# Patient Record
Sex: Female | Born: 1940
Health system: Southern US, Community
[De-identification: ages and names within clinical notes are randomized; demographics above are authoritative.]

## PROBLEM LIST (undated history)

## (undated) DIAGNOSIS — M25511 Pain in right shoulder: Secondary | ICD-10-CM

## (undated) DIAGNOSIS — R42 Dizziness and giddiness: Secondary | ICD-10-CM

## (undated) DIAGNOSIS — E669 Obesity, unspecified: Secondary | ICD-10-CM

## (undated) DIAGNOSIS — M25512 Pain in left shoulder: Secondary | ICD-10-CM

## (undated) DIAGNOSIS — E119 Type 2 diabetes mellitus without complications: Secondary | ICD-10-CM

## (undated) DIAGNOSIS — G4700 Insomnia, unspecified: Secondary | ICD-10-CM

## (undated) DIAGNOSIS — M1990 Unspecified osteoarthritis, unspecified site: Secondary | ICD-10-CM

## (undated) DIAGNOSIS — I471 Supraventricular tachycardia: Secondary | ICD-10-CM

## (undated) DIAGNOSIS — I499 Cardiac arrhythmia, unspecified: Secondary | ICD-10-CM

## (undated) HISTORY — DX: Insomnia, unspecified: G47.00

## (undated) HISTORY — PX: CARDIAC CATHETERIZATION: SHX172

## (undated) HISTORY — DX: Unspecified osteoarthritis, unspecified site: M19.90

## (undated) HISTORY — DX: Supraventricular tachycardia: I47.1

## (undated) HISTORY — PX: TONSILLECTOMY: SUR1361

## (undated) HISTORY — DX: Pain in left shoulder: M25.512

## (undated) HISTORY — DX: Obesity, unspecified: E66.9

## (undated) HISTORY — DX: Dizziness and giddiness: R42

## (undated) HISTORY — DX: Pain in right shoulder: M25.511

---

## 1968-11-06 HISTORY — PX: EXCISIONAL HEMORRHOIDECTOMY: SHX1541

## 1983-11-07 HISTORY — PX: CHOLECYSTECTOMY: SHX55

## 1988-11-06 DIAGNOSIS — M1990 Unspecified osteoarthritis, unspecified site: Secondary | ICD-10-CM

## 1988-11-06 HISTORY — DX: Unspecified osteoarthritis, unspecified site: M19.90

## 2001-08-08 ENCOUNTER — Ambulatory Visit (HOSPITAL_COMMUNITY): Admission: RE | Admit: 2001-08-08 | Discharge: 2001-08-08 | Payer: Self-pay | Admitting: Family Medicine

## 2001-08-08 ENCOUNTER — Encounter: Payer: Self-pay | Admitting: Family Medicine

## 2002-11-09 ENCOUNTER — Inpatient Hospital Stay (HOSPITAL_COMMUNITY): Admission: EM | Admit: 2002-11-09 | Discharge: 2002-11-10 | Payer: Self-pay | Admitting: Emergency Medicine

## 2002-11-09 ENCOUNTER — Encounter: Payer: Self-pay | Admitting: Emergency Medicine

## 2003-09-23 ENCOUNTER — Ambulatory Visit (HOSPITAL_COMMUNITY): Admission: RE | Admit: 2003-09-23 | Discharge: 2003-09-23 | Payer: Self-pay | Admitting: Family Medicine

## 2004-03-21 ENCOUNTER — Ambulatory Visit (HOSPITAL_COMMUNITY): Admission: RE | Admit: 2004-03-21 | Discharge: 2004-03-21 | Payer: Self-pay | Admitting: Family Medicine

## 2004-10-07 ENCOUNTER — Other Ambulatory Visit: Admission: RE | Admit: 2004-10-07 | Discharge: 2004-10-07 | Payer: Self-pay | Admitting: Obstetrics and Gynecology

## 2006-03-19 ENCOUNTER — Emergency Department (HOSPITAL_COMMUNITY): Admission: EM | Admit: 2006-03-19 | Discharge: 2006-03-20 | Payer: Self-pay | Admitting: Emergency Medicine

## 2006-05-24 ENCOUNTER — Ambulatory Visit: Payer: Self-pay | Admitting: Family Medicine

## 2006-05-25 ENCOUNTER — Encounter: Admission: RE | Admit: 2006-05-25 | Discharge: 2006-05-25 | Payer: Self-pay | Admitting: Family Medicine

## 2006-09-19 ENCOUNTER — Ambulatory Visit: Payer: Self-pay | Admitting: Family Medicine

## 2007-01-28 ENCOUNTER — Ambulatory Visit: Payer: Self-pay | Admitting: Family Medicine

## 2007-01-28 LAB — CONVERTED CEMR LAB
HDL: 63.6 mg/dL (ref >39.0)
VLDL: 17 mg/dL (ref 0–40)

## 2007-02-12 ENCOUNTER — Encounter: Payer: Self-pay | Admitting: Family Medicine

## 2007-05-23 ENCOUNTER — Ambulatory Visit: Payer: Self-pay | Admitting: Family Medicine

## 2007-05-23 DIAGNOSIS — H60509 Unspecified acute noninfective otitis externa, unspecified ear: Secondary | ICD-10-CM | POA: Insufficient documentation

## 2007-05-23 DIAGNOSIS — G4700 Insomnia, unspecified: Secondary | ICD-10-CM | POA: Insufficient documentation

## 2007-06-04 ENCOUNTER — Telehealth (INDEPENDENT_AMBULATORY_CARE_PROVIDER_SITE_OTHER): Payer: Self-pay | Admitting: *Deleted

## 2007-06-24 ENCOUNTER — Ambulatory Visit: Payer: Self-pay

## 2007-06-24 ENCOUNTER — Ambulatory Visit: Payer: Self-pay | Admitting: Family Medicine

## 2007-06-24 DIAGNOSIS — M79609 Pain in unspecified limb: Secondary | ICD-10-CM | POA: Insufficient documentation

## 2007-06-25 ENCOUNTER — Telehealth (INDEPENDENT_AMBULATORY_CARE_PROVIDER_SITE_OTHER): Payer: Self-pay | Admitting: *Deleted

## 2007-07-01 ENCOUNTER — Telehealth (INDEPENDENT_AMBULATORY_CARE_PROVIDER_SITE_OTHER): Payer: Self-pay | Admitting: *Deleted

## 2007-07-10 ENCOUNTER — Encounter (INDEPENDENT_AMBULATORY_CARE_PROVIDER_SITE_OTHER): Payer: Self-pay | Admitting: Family Medicine

## 2007-09-03 ENCOUNTER — Telehealth (INDEPENDENT_AMBULATORY_CARE_PROVIDER_SITE_OTHER): Payer: Self-pay | Admitting: *Deleted

## 2007-09-04 ENCOUNTER — Encounter (INDEPENDENT_AMBULATORY_CARE_PROVIDER_SITE_OTHER): Payer: Self-pay | Admitting: Family Medicine

## 2007-09-12 ENCOUNTER — Encounter (INDEPENDENT_AMBULATORY_CARE_PROVIDER_SITE_OTHER): Payer: Self-pay | Admitting: Family Medicine

## 2007-09-12 ENCOUNTER — Encounter: Admission: RE | Admit: 2007-09-12 | Discharge: 2007-09-12 | Payer: Self-pay | Admitting: Family Medicine

## 2007-10-01 ENCOUNTER — Encounter (INDEPENDENT_AMBULATORY_CARE_PROVIDER_SITE_OTHER): Payer: Self-pay | Admitting: Family Medicine

## 2007-10-01 ENCOUNTER — Encounter: Admission: RE | Admit: 2007-10-01 | Discharge: 2007-10-01 | Payer: Self-pay | Admitting: Interventional Radiology

## 2007-11-29 ENCOUNTER — Ambulatory Visit: Payer: Self-pay | Admitting: Family Medicine

## 2007-12-10 ENCOUNTER — Encounter (INDEPENDENT_AMBULATORY_CARE_PROVIDER_SITE_OTHER): Payer: Self-pay | Admitting: Family Medicine

## 2007-12-10 ENCOUNTER — Encounter: Admission: RE | Admit: 2007-12-10 | Discharge: 2007-12-10 | Payer: Self-pay | Admitting: Interventional Radiology

## 2007-12-24 ENCOUNTER — Encounter: Admission: RE | Admit: 2007-12-24 | Discharge: 2007-12-24 | Payer: Self-pay | Admitting: Interventional Radiology

## 2008-01-01 ENCOUNTER — Encounter: Admission: RE | Admit: 2008-01-01 | Discharge: 2008-01-01 | Payer: Self-pay | Admitting: Interventional Radiology

## 2008-03-02 ENCOUNTER — Ambulatory Visit: Payer: Self-pay | Admitting: Internal Medicine

## 2008-03-02 DIAGNOSIS — H6690 Otitis media, unspecified, unspecified ear: Secondary | ICD-10-CM | POA: Insufficient documentation

## 2008-03-02 DIAGNOSIS — J0100 Acute maxillary sinusitis, unspecified: Secondary | ICD-10-CM | POA: Insufficient documentation

## 2008-03-11 ENCOUNTER — Encounter: Payer: Self-pay | Admitting: Family Medicine

## 2008-04-13 ENCOUNTER — Ambulatory Visit: Payer: Self-pay | Admitting: Family Medicine

## 2008-04-13 ENCOUNTER — Encounter (INDEPENDENT_AMBULATORY_CARE_PROVIDER_SITE_OTHER): Payer: Self-pay | Admitting: *Deleted

## 2008-04-13 DIAGNOSIS — H9190 Unspecified hearing loss, unspecified ear: Secondary | ICD-10-CM | POA: Insufficient documentation

## 2008-04-21 ENCOUNTER — Encounter: Payer: Self-pay | Admitting: Family Medicine

## 2008-05-18 ENCOUNTER — Ambulatory Visit: Payer: Self-pay | Admitting: Family Medicine

## 2008-05-18 ENCOUNTER — Encounter (INDEPENDENT_AMBULATORY_CARE_PROVIDER_SITE_OTHER): Payer: Self-pay | Admitting: *Deleted

## 2008-07-28 ENCOUNTER — Telehealth (INDEPENDENT_AMBULATORY_CARE_PROVIDER_SITE_OTHER): Payer: Self-pay | Admitting: *Deleted

## 2008-11-06 DIAGNOSIS — I499 Cardiac arrhythmia, unspecified: Secondary | ICD-10-CM

## 2008-11-06 HISTORY — DX: Cardiac arrhythmia, unspecified: I49.9

## 2009-05-21 ENCOUNTER — Ambulatory Visit: Payer: Self-pay | Admitting: Family Medicine

## 2009-06-06 LAB — CONVERTED CEMR LAB
AST: 21 units/L (ref 0–37)
Alkaline Phosphatase: 73 units/L (ref 39–117)
Bilirubin, Direct: 0 mg/dL (ref 0.0–0.3)
CO2: 32 meq/L (ref 19–32)
Cholesterol: 175 mg/dL (ref 0–200)
Creatinine, Ser: 0.6 mg/dL (ref 0.4–1.2)
Eosinophils Absolute: 0.1 10*3/uL (ref 0.0–0.7)
Glucose, Bld: 92 mg/dL (ref 70–99)
Hemoglobin: 14.2 g/dL (ref 12.0–15.0)
Lymphs Abs: 1.8 10*3/uL (ref 0.7–4.0)
MCV: 88.1 fL (ref 78.0–100.0)
Monocytes Relative: 6.4 % (ref 3.0–12.0)
Platelets: 250 10*3/uL (ref 150.0–400.0)
TSH: 1.9 microintl units/mL (ref 0.35–5.50)
Total Bilirubin: 0.9 mg/dL (ref 0.3–1.2)
Total Protein: 7.1 g/dL (ref 6.0–8.3)
Triglycerides: 162 mg/dL — ABNORMAL HIGH (ref 0.0–149.0)
VLDL: 32.4 mg/dL (ref 0.0–40.0)

## 2009-06-07 ENCOUNTER — Encounter (INDEPENDENT_AMBULATORY_CARE_PROVIDER_SITE_OTHER): Payer: Self-pay | Admitting: *Deleted

## 2009-09-14 ENCOUNTER — Ambulatory Visit: Payer: Self-pay | Admitting: Family

## 2009-09-15 ENCOUNTER — Ambulatory Visit: Payer: Self-pay | Admitting: Family

## 2009-09-15 DIAGNOSIS — M5430 Sciatica, unspecified side: Secondary | ICD-10-CM | POA: Insufficient documentation

## 2010-05-02 ENCOUNTER — Observation Stay (HOSPITAL_COMMUNITY): Admission: EM | Admit: 2010-05-02 | Discharge: 2010-05-03 | Payer: Self-pay | Admitting: Emergency Medicine

## 2010-05-02 ENCOUNTER — Encounter (INDEPENDENT_AMBULATORY_CARE_PROVIDER_SITE_OTHER): Payer: Self-pay | Admitting: Cardiology

## 2011-01-11 ENCOUNTER — Ambulatory Visit (HOSPITAL_BASED_OUTPATIENT_CLINIC_OR_DEPARTMENT_OTHER)
Admission: RE | Admit: 2011-01-11 | Discharge: 2011-01-11 | Disposition: A | Discharge status: Home / Self Care | Payer: Medicare Other | Source: Ambulatory Visit | Attending: Family Medicine | Admitting: Family Medicine

## 2011-01-11 ENCOUNTER — Encounter: Payer: Self-pay | Admitting: Family Medicine

## 2011-01-11 ENCOUNTER — Other Ambulatory Visit: Payer: Self-pay | Admitting: Family Medicine

## 2011-01-11 ENCOUNTER — Ambulatory Visit (INDEPENDENT_AMBULATORY_CARE_PROVIDER_SITE_OTHER): Payer: Medicare Other | Admitting: Family Medicine

## 2011-01-11 DIAGNOSIS — M545 Low back pain, unspecified: Secondary | ICD-10-CM

## 2011-01-11 DIAGNOSIS — M5380 Other specified dorsopathies, site unspecified: Secondary | ICD-10-CM | POA: Insufficient documentation

## 2011-01-11 DIAGNOSIS — M25519 Pain in unspecified shoulder: Secondary | ICD-10-CM | POA: Insufficient documentation

## 2011-01-17 NOTE — Assessment & Plan Note (Signed)
Summary: KNEE AND SHOULDER PAIN/NP/LP # [phone]   Vital Signs:  Patient profile:   70 year old female Height:      64 inches (162.56 cm) Weight:      244.6 pounds (111.18 kg) BMI:     42.14 Temp:     98.1 degrees F (36.72 degrees C) oral Pulse rate:   68 / minute BP sitting:   148 / 80  (right arm)  Vitals Entered By: Baxter Hire) (January 11, 2011 11:20 AM) CC: Right and Left arms / Left hip pain Pain Assessment Patient in pain? yes     Location: arms Intensity: 2 Onset of pain  Arm pain worse at night / left hip worse at night 10/10 Nutritional Status BMI of > 30 = obese  Does patient need assistance? Functional Status Self care Ambulation Normal   Primary Care Provider:  Loreen Freud DO  CC:  Right and Left arms / Left hip pain.  History of Present Illness: 70 yo F here with multiple complaints.  1. Bilateral shoulder pain Pain started in left shoulder 1 year ago, right shoulder 1 month ago No known injury + night pain Pain worse with reaching and overhead activities Can move arms fully but just hurts to do so No numbness or tingling No neck pain No remote shoulder problems Has not had x-rays, injection or other workup for these issues since they started. Is right handed  2. Left low back pain Describes several year history of low back pain, posterior hip pain with radiation into left leg Pain worse at bedtime Occasional numbness but pain is primary complaint No bowel/bladder dysfunction No groin pain - her hip pain is synonymous with this back pain  Patient also having left hip pain and right knee pain - deferred, also seeing an orthopedist in [place] for R knee DJD - considering partial knee replacement.  Habits & Providers  Alcohol-Tobacco-Diet     Tobacco Status: never  Current Problems (verified): 1)  Lumbago  (ICD-724.2) 2)  Sciatica, Left  (ICD-724.3) 3)  Preventive Health Care  (ICD-V70.0) 4)  Foot Pain, Left   (ICD-729.5) 5)  Decreased Hearing, Right Ear  (ICD-389.9) 6)  Acute Maxillary Sinusitis  (ICD-461.0) 7)  Otitis Media, Acute  (ICD-382.9) 8)  Leg Pain, Right  (ICD-729.5) 9)  Otitis Externa, Acute Nec  (ICD-380.22) 10)  Insomnia, Persistent  (ICD-307.42)  Current Medications (verified): 1)  Aleve 220 Mg Caps (Naproxen Sodium) .... One Tablet Twice Daily As Needed For Pain 2)  Beta-Blocker - Unknown Which One  Allergies: 1)  ! Codeine 2)  ! * Rum  Family History: + heart disease mom negative DM, HTN  Social History: never smoked occasional glass of wine Retired from [employer] Smoking Status:  never  Physical Exam  General:  NAD, obese Msk:  Bilateral shoulders: No gross deformity or bruising. No focal biceps or AC joint TTP. FROM with painful arcs - no frozen shoulder Strength 4/5 with empty can bilaterally, 5/5 with resisted IR/ER - pain reproduced with empty can but not with IR/ER. + Hawkins and neers bilaterally Negative Yergasons NVI distally.  Back: Mildly accentuated lordosis. No other gross deformity, evidence of scoliosis. No focal midline/bony TTP.  Mild L lumbar paraspinal TTP.  Mod TTP L buttock, prox hamstrings, greater trochanter. ROM mildly limited with flexion, full extension Negative logroll of hip - FROM. Strength 4/5 with hip flexion, abduction, ext rotation, knee extension - ? limited by pain though.  Otherwise  5/5 strength BLEs MSRs 1+ and equal bilateral patellar and achilles tendons Sensation intact to light touch. Negative SLRs.   Impression & Recommendations:  Problem # 1:  LUMBAGO (ICD-724.2) Assessment Deteriorated Patient's symptoms and exam consistent with left lumbar radiculopathy.  Does have tenderness at trochanter but tenderness is also diffuse in left lumbar paraspinal region, buttock, proximal left leg which is consistent with irritated lumbar nerve root.  Start physical therapy for exercises, stretches and modalities.   Continue aleve twice a day with food.  Will try to avoid dose pack.  X-rays today to assess her level of DDD.  If not improving will consider MRI of lumbar spine.   Her updated medication list for this problem includes:    Aleve 220 Mg Caps (Naproxen sodium) ..... One tablet twice daily as needed for pain  Orders: Diagnostic X-Ray/Fluoroscopy (Diagnostic X-Ray/Flu)  Problem # 2:  SHOULDER PAIN, BILATERAL (ICD-719.41) Assessment: New No acute injury to suggest rotator cuff tear.  Full motion so no adhesive capsulitis.  Symptoms and exam c/w bilateral rotator cuff impingement.  Offered cortisone injections today but she declined.  Would like to try physical therapy first.  Continue aleve.  Ice or heat as needed.  F/u in 1 month for recheck.  Reconsider injections if not improving.  Her updated medication list for this problem includes:    Aleve 220 Mg Caps (Naproxen sodium) ..... One tablet twice daily as needed for pain  Complete Medication List: 1)  Aleve 220 Mg Caps (Naproxen sodium) .... One tablet twice daily as needed for pain 2)  Beta-blocker - Unknown Which One   Patient Instructions: 1)  You have bilateral rotator cuff impingement and left lumbar radiculopathy (pinched nerve in low back). 2)  Both are treated similarly initially. 3)  Start physical therapy for both over next 4-6 weeks and do home exercises as directed. 4)  Ice or heat, whichever feels better for 15 minutes at a time 3-4 times a day. 5)  A cortisone injection is an option for both of your shoulders if not improving. 6)  Continue the aleve 1-2 tabs twice a day with food. 7)  Get the x-rays of your back at your convenience. 8)  If back is not improving, next step is to do MRI and consider other medicines, shots, or surgery depending on how study looks. 9)  Follow up with me in 1 month for reevaluation.   Orders Added: 1)  Diagnostic X-Ray/Fluoroscopy [Diagnostic X-Ray/Flu] 2)  New Patient Level IV [11914]

## 2011-01-18 ENCOUNTER — Ambulatory Visit: Payer: Medicare Other | Admitting: Physical Therapy

## 2011-01-22 LAB — CARDIAC PANEL(CRET KIN+CKTOT+MB+TROPI)
CK, MB: 1.9 ng/mL (ref 0.3–4.0)
Relative Index: INVALID (ref 0.0–2.5)
Total CK: 34 U/L (ref 7–177)
Total CK: 46 U/L (ref 7–177)

## 2011-01-22 LAB — COMPREHENSIVE METABOLIC PANEL
ALT: 12 U/L (ref 0–35)
Alkaline Phosphatase: 65 U/L (ref 39–117)
CO2: 28 mEq/L (ref 19–32)
Creatinine, Ser: 0.62 mg/dL (ref 0.4–1.2)
Glucose, Bld: 93 mg/dL (ref 70–99)
Potassium: 4.4 mEq/L (ref 3.5–5.1)
Total Bilirubin: 0.6 mg/dL (ref 0.3–1.2)

## 2011-01-22 LAB — DIFFERENTIAL
Basophils Absolute: 0 10*3/uL (ref 0.0–0.1)
Lymphocytes Relative: 21 % (ref 12–46)
Monocytes Absolute: 1 10*3/uL (ref 0.1–1.0)
Monocytes Relative: 6 % (ref 3–12)

## 2011-01-22 LAB — CULTURE, BLOOD (ROUTINE X 2): Culture: NO GROWTH

## 2011-01-22 LAB — CBC
HCT: 43.6 % (ref 36.0–46.0)
Hemoglobin: 15 g/dL (ref 12.0–15.0)
MCH: 30.1 pg (ref 26.0–34.0)
MCHC: 34.3 g/dL (ref 30.0–36.0)
Platelets: ADEQUATE 10*3/uL (ref 150–400)
RBC: 4.97 MIL/uL (ref 3.87–5.11)
RDW: 13.7 % (ref 11.5–15.5)
WBC: 16.4 10*3/uL — ABNORMAL HIGH (ref 4.0–10.5)

## 2011-01-25 ENCOUNTER — Ambulatory Visit: Payer: Medicare Other | Attending: Family Medicine | Admitting: Physical Therapy

## 2011-01-25 DIAGNOSIS — M2550 Pain in unspecified joint: Secondary | ICD-10-CM | POA: Insufficient documentation

## 2011-01-25 DIAGNOSIS — IMO0001 Reserved for inherently not codable concepts without codable children: Secondary | ICD-10-CM | POA: Insufficient documentation

## 2011-01-25 DIAGNOSIS — R293 Abnormal posture: Secondary | ICD-10-CM | POA: Insufficient documentation

## 2011-01-25 DIAGNOSIS — M2560 Stiffness of unspecified joint, not elsewhere classified: Secondary | ICD-10-CM | POA: Insufficient documentation

## 2011-02-02 ENCOUNTER — Ambulatory Visit: Payer: Medicare Other | Attending: Family Medicine | Admitting: Physical Therapy

## 2011-02-02 DIAGNOSIS — IMO0001 Reserved for inherently not codable concepts without codable children: Secondary | ICD-10-CM | POA: Insufficient documentation

## 2011-02-02 DIAGNOSIS — M2560 Stiffness of unspecified joint, not elsewhere classified: Secondary | ICD-10-CM | POA: Insufficient documentation

## 2011-02-02 DIAGNOSIS — R293 Abnormal posture: Secondary | ICD-10-CM | POA: Insufficient documentation

## 2011-02-02 DIAGNOSIS — M2550 Pain in unspecified joint: Secondary | ICD-10-CM | POA: Insufficient documentation

## 2011-02-08 ENCOUNTER — Ambulatory Visit: Payer: Medicare Other | Attending: Family Medicine | Admitting: Physical Therapy

## 2011-02-08 DIAGNOSIS — M2550 Pain in unspecified joint: Secondary | ICD-10-CM | POA: Insufficient documentation

## 2011-02-08 DIAGNOSIS — R293 Abnormal posture: Secondary | ICD-10-CM | POA: Insufficient documentation

## 2011-02-08 DIAGNOSIS — IMO0001 Reserved for inherently not codable concepts without codable children: Secondary | ICD-10-CM | POA: Insufficient documentation

## 2011-02-08 DIAGNOSIS — M2560 Stiffness of unspecified joint, not elsewhere classified: Secondary | ICD-10-CM | POA: Insufficient documentation

## 2011-02-16 ENCOUNTER — Ambulatory Visit: Payer: Medicare Other | Admitting: Physical Therapy

## 2011-02-17 ENCOUNTER — Encounter: Payer: Self-pay | Admitting: Family Medicine

## 2011-02-17 ENCOUNTER — Ambulatory Visit (INDEPENDENT_AMBULATORY_CARE_PROVIDER_SITE_OTHER): Payer: Medicare Other | Admitting: Family Medicine

## 2011-02-17 VITALS — BP 139/87 | HR 83 | Temp 98.2°F | Ht 64.0 in | Wt 234.0 lb

## 2011-02-17 DIAGNOSIS — M25519 Pain in unspecified shoulder: Secondary | ICD-10-CM

## 2011-02-21 ENCOUNTER — Encounter: Payer: Self-pay | Admitting: Family Medicine

## 2011-02-21 NOTE — Assessment & Plan Note (Signed)
No acute injury to suggest rotator cuff tear.  Full motion so no adhesive capsulitis.  Symptoms and exam c/w bilateral rotator cuff impingement.  Left shoulder much improved but right shoulder still painful.  Declined cortisone injection again today - would like to continue with PT and aleve which i believe is reasonable given she's only been over 2-3 weeks.  If not improving after 4-6 weeks from today, will consider injection and/or shoulder ultrasound.

## 2011-02-21 NOTE — Progress Notes (Signed)
Subjective:    Patient ID: Anita Moore, female    DOB: 02-25-1941, 70 y.o.   MRN: [id number]  HPI CC:  Right and Left arms / Left hip pain.  History of Present Illness: 70 yo F here with multiple complaints.  1. Bilateral shoulder pain Last OV 6 weeks ago: Pain started in left shoulder 1 year ago, right shoulder 1 month ago No known injury + night pain Pain worse with reaching and overhead activities Can move arms fully but just hurts to do so No numbness or tingling No neck pain No remote shoulder problems Has not had x-rays, injection or other workup for these issues since they started. Is right handed  Today: Reports left shoulder much improved but right shoulder still painful. Gone to PT for only 5-6 visits over past 2-3 weeks. Gets relief there but just started doing theraband exercises + night pain Does not want injection - wants to continue with therapy Taking aleve as needed.  2. Left low back pain Last OV: Describes several year history of low back pain, posterior hip pain with radiation into left leg Pain worse at bedtime Occasional numbness but pain is primary complaint No bowel/bladder dysfunction No groin pain - her hip pain is synonymous with this back pain This has improved from last visit - wanted to focus on her shoulders. Patient also having left hip pain and right knee pain - deferred, also seeing an orthopedist in [place] for R knee DJD - considering partial knee replacement.  Past Medical History  Diagnosis Date  . Insomnia   . Bilateral shoulder pain     No current outpatient prescriptions on file prior to visit.    No past surgical history on file.  Allergies  Allergen Reactions  . Codeine     History   Social History  . Marital Status: Divorced    Spouse Name: N/A    Number of Children: N/A  . Years of Education: N/A   Occupational History  . Not on file.   Social History Main Topics  . Smoking status: Never  Smoker   . Smokeless tobacco: Not on file  . Alcohol Use: Not on file  . Drug Use: Not on file  . Sexually Active: Not on file   Other Topics Concern  . Not on file   Social History Narrative  . No narrative on file    Family History  Problem Relation Age of Onset  . Heart attack Mother   . Diabetes Neg Hx   . Hypertension Neg Hx     BP 139/87  Pulse 83  Temp(Src) 98.2 F (36.8 C) (Oral)  Ht 5\' 4"  (1.626 m)  Wt 234 lb (106.142 kg)  BMI 40.17 kg/m2  Review of Systems See HPI above.     Objective:   Physical Exam General:  NAD, obese Msk:  Bilateral shoulders: No gross deformity or bruising. No focal biceps or AC joint TTP. FROM with painful arcs R > L - no frozen shoulder.  Active ROM on right 100 degrees abduction, 100 degrees flexion but painful. Strength 4/5 with empty can bilaterally, 5/5 with resisted IR/ER - pain reproduced with empty can but not with IR/ER. + Hawkins and neers bilaterally R worse than L. NVI distally.     Assessment & Plan:  1. Bilateral shoulder pain - No acute injury to suggest rotator cuff tear.  Full motion so no adhesive capsulitis.  Symptoms and exam c/w bilateral rotator cuff impingement.  Left shoulder much improved but right shoulder still painful.  Declined cortisone injection again today - would like to continue with PT and aleve which i believe is reasonable given she's only been over 2-3 weeks.  If not improving after 4-6 weeks from today, will consider injection and/or shoulder ultrasound.

## 2011-03-08 ENCOUNTER — Ambulatory Visit: Payer: Medicare Other | Attending: Family Medicine | Admitting: Physical Therapy

## 2011-03-08 DIAGNOSIS — R293 Abnormal posture: Secondary | ICD-10-CM | POA: Insufficient documentation

## 2011-03-08 DIAGNOSIS — M2550 Pain in unspecified joint: Secondary | ICD-10-CM | POA: Insufficient documentation

## 2011-03-08 DIAGNOSIS — IMO0001 Reserved for inherently not codable concepts without codable children: Secondary | ICD-10-CM | POA: Insufficient documentation

## 2011-03-08 DIAGNOSIS — M2560 Stiffness of unspecified joint, not elsewhere classified: Secondary | ICD-10-CM | POA: Insufficient documentation

## 2011-03-14 ENCOUNTER — Ambulatory Visit: Payer: Medicare Other | Admitting: Rehabilitation

## 2011-03-20 ENCOUNTER — Ambulatory Visit (INDEPENDENT_AMBULATORY_CARE_PROVIDER_SITE_OTHER): Payer: Medicare Other | Admitting: Family Medicine

## 2011-03-20 ENCOUNTER — Encounter: Payer: Self-pay | Admitting: Family Medicine

## 2011-03-20 VITALS — BP 151/81 | HR 68 | Temp 98.4°F | Ht 64.0 in | Wt 235.0 lb

## 2011-03-20 DIAGNOSIS — M25519 Pain in unspecified shoulder: Secondary | ICD-10-CM

## 2011-03-20 NOTE — Progress Notes (Signed)
Subjective:    Patient ID: Anita Moore, female    DOB: [birth year], 70 y.o.   MRN: [id number]  HPI  CC:  Right and Left arms / Left hip pain.  History of Present Illness: 70 yo F here for 1 month f/u bilateral shoulder pain.  1. Bilateral shoulder pain Initial OV 10 weeks ago: Pain started in left shoulder >1 year ago, right shoulder 1 month ago No known injury + night pain Pain worse with reaching and overhead activities Can move arms fully but just hurts to do so No numbness or tingling No neck pain No remote shoulder problems Has not had x-rays, injection or other workup for these issues since they started. Is right handed  Today: Bilateral shoulder pain down to 1/10 Started taking hyaluronic acid a few days ago and noted this has helped with pain. Gone to PT for total of 5-6 visits leading up to last OV.  States has been compliant with home exercise program. Reiterated she does not want injection - wants to continue with therapy and home exercises, hyaluronic acid. Taking aleve as needed.  Past Medical History  Diagnosis Date  . Insomnia   . Bilateral shoulder pain     No current outpatient prescriptions on file prior to visit.    No past surgical history on file.  Allergies  Allergen Reactions  . Codeine     History   Social History  . Marital Status: Divorced    Spouse Name: N/A    Number of Children: N/A  . Years of Education: N/A   Occupational History  . Not on file.   Social History Main Topics  . Smoking status: Never Smoker   . Smokeless tobacco: Not on file  . Alcohol Use: Not on file  . Drug Use: Not on file  . Sexually Active: Not on file   Other Topics Concern  . Not on file   Social History Narrative  . No narrative on file    Family History  Problem Relation Age of Onset  . Heart attack Mother   . Diabetes Neg Hx   . Hypertension Neg Hx     BP 151/81  Pulse 68  Temp(Src) 98.4 F (36.9 C) (Oral)  Ht 5\' 4"   (1.626 m)  Wt 235 lb (106.595 kg)  BMI 40.34 kg/m2  Review of Systems  See HPI above.     Objective:   Physical Exam  General:  NAD, obese Msk:  Bilateral shoulders: No gross deformity or bruising. No focal biceps or AC joint TTP. FROM with painful arcs R > L - no frozen shoulder.  Active ROM on right 100 degrees abduction, 100 degrees flexion. Strength 4/5 with empty can bilaterally, 5/5 with resisted IR/ER - pain reproduced with empty can but not with IR/ER. + Hawkins and neers bilaterally R worse than L. NVI distally.     Assessment & Plan:  1. Bilateral shoulder pain - Patient's pain subjectively improved - unsure if she went to additional PT sessions since last visit.  She canceled last 2 weeks.  States she has been compliant with home exercises.  Again, no acute injury to suggest rotator cuff tear.  Full motion so no adhesive capsulitis.  Symptoms and exam c/w bilateral rotator cuff impingement.  She would like to continue with conservative treatment and hyaluronic acid.  Advised her to continue with PT as well.  Does not want a cortisone injection.  Given that she feels better, will hold off on  this, ultrasound.  F/u in 6 weeks for recheck.

## 2011-03-20 NOTE — Assessment & Plan Note (Signed)
Patient's pain subjectively improved - unsure if she went to additional PT sessions since last visit.  She canceled last 2 weeks.  States she has been compliant with home exercises.  Again, no acute injury to suggest rotator cuff tear.  Full motion so no adhesive capsulitis.  Symptoms and exam c/w bilateral rotator cuff impingement.  She would like to continue with conservative treatment and hyaluronic acid.  Advised her to continue with PT as well.  Does not want a cortisone injection.  Given that she feels better, will hold off on this, ultrasound.  F/u in 6 weeks for recheck.

## 2011-03-24 NOTE — Discharge Summary (Signed)
NAME:  Anita Moore, Anita Moore                   ACCOUNT NO.:  [id number]   MEDICAL RECORD NO.:  [id number]                   PATIENT TYPE:  INP   LOCATION:  2899                                 FACILITY:  [facility]   PHYSICIAN:  Francisca December, M.D.               DATE OF BIRTH:  10-20-1941   DATE OF ADMISSION:  11/09/2002  DATE OF DISCHARGE:  11/10/2002                                 DISCHARGE SUMMARY   ADMISSION DIAGNOSES:  1. Unstable angina, rule out myocardial infarction.  2. Hyperlipidemia.  3. Degenerative joint disease.  4. Obesity.   DISCHARGE DIAGNOSES:  1. Unstable angina, myocardial infarction ruled out, suspect     gastrointestinal etiology.  2. Essentially normal coronaries per cardiac catheterization.  3. Hyperlipidemia.  4. Degenerative joint disease.  5. Obesity.   HISTORY OF PRESENT ILLNESS:  The patient is a 70 year old white female  patient of Dr. Amil Amen with a history of hyperlipidemia and degenerative  joint disease.  She had developed substernal chest burning pain around 1  a.m. on November 09, 2002.  This persisted with varying intensity until she  arrived at the [facility] emergency room.  She also had left-hand  numbness.  She has had chronic left shoulder and upper arm pain. No nausea,  diaphoresis, or shortness of breath.  She had nondiagnostic Cardiolite in  the fall of 2003 and declined cardiac catheterization at that time.   The patient is admitted for rule out MI and probable cardiac  catheterization.   PROCEDURE:  Cardiac catheterization by Dr. Amil Amen on November 10, 2002.   COMPLICATIONS:  None.   CONSULTATIONS:  None.   HOSPITAL COURSE:  The patient was admitted to [facility] on November 09, 2002, by Dr. Amil Amen for unstable angina pain.  EKG was nonacute with  nonspecific T wave changes in the inferolateral leads.  She was started on  aspirin, beta blocker, and Lovenox.  Dr. Amil Amen discussed the need for  cardiac  catheterization and the patient consented.  She agreed to the risks  and benefits.   Admission laboratory studies were essentially within normal limits.  Cardiac  enzymes negative x2.   The patient was taken to the cardiac catheterization lab by Dr. Amil Amen on  November 10, 2002.  This revealed essentially normal coronary arteries and  normal LV function. The procedure went well.  There were no complications.  Perclose was used.  The patient was delivered to the shortstay area in  stable condition.   Dr. Amil Amen feels that the patient's symptoms are noncardiac in etiology. He  recommends initiation of proton pump inhibitor therapy.   The patient will be discharged to home after one hour of recovery time, so  long as the right groin remains stable and she is hemodynamically stable.   Discharge instructions will be reviewed with the patient.   DISCHARGE MEDICATIONS:  1. Lipitor 20 mg q.h.s.  2. Aspirin 81 mg.  3. Vioxx as needed as directed.  4. Protonix 40 mg a day (new) prescription given.  5. The patient may take Tylenol as needed as directed on the bottle for     pain.   ACTIVITY:  No strenuous activity, lifting more than 5 pounds, or driving for  two days. Then she may return to normal activity.   DIET:  Low fat, low cholesterol, low salt diet. She is to keep away from  foods that will exacerbate her indigestion type symptoms.   DISCHARGE INSTRUCTIONS:  She should keep the bandage on her right groin x2  days. She is given complete instructions on Perclose care.  She is not to  soak in the tub or swim for one week. She may shower and should dry the area  well.  She is asked to call the office with any problems or questions.  A  follow-up appointment will need to be scheduled for two weeks with Dr.  Amil Amen.  Unfortunately, our telephone system is down at the office and I  will have the patient call later today or tomorrow to make this appointment.     Georgiann Cocker Jernejcic,  P.A.                   Francisca December, M.D.    TCJ/MEDQ  D:  11/10/2002  T:  11/10/2002  Job:  [id number]
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
## 2011-03-24 NOTE — Cardiovascular Report (Signed)
NAME:  Anita Moore, Anita Moore                   ACCOUNT NO.:  [id number]   MEDICAL RECORD NO.:  [id number]                   PATIENT TYPE:  INP   LOCATION:  5529                                 FACILITY:  [facility]   PHYSICIAN:  Francisca December, M.D.               DATE OF BIRTH:  [birth year]   DATE OF PROCEDURE:  11/10/2002  DATE OF DISCHARGE:                              CARDIAC CATHETERIZATION   PROCEDURES:  1. Left heart catheterization.  2. Coronary angiography.  3. Left ventriculogram.  4. Left femoral artery angiogram.  5. Percutaneous closure (Perclose) right internal artery.   INDICATIONS FOR PROCEDURE:  The patient is a 70 year old woman with atypical  angina.  Approximately three months ago she underwent a myocardial perfusion  study which was indeterminate.  She declined cardiac catheterization at that  time.  Yesterday she developed burning substernal chest pain which lasted  throughout the day and well into the evening.  This only resolved upon  arrival at [facility] without any medications administered other than  oxygen.  She did have some left hand numbness associated.  EKG was  unremarkable.  Myocardial infarction has been ruled out by serial CK-MB and  troponin enzymes.  She was brought to the cardiac catheterization laboratory  at this time to identify the extent of the disease and provide further  therapeutic options.   PROCEDURE IN DETAIL:  The patient was brought to the cardiac catheterization  laboratory in the postabsorptive state.  The right groin was prepped and  draped in the usual sterile fashion.  Local anesthesia was obtained with the  infiltration of 1% Lidocaine.  A 6-French catheter sheath was inserted  percutaneously in the right femoral artery utilizing an anterior posterior  right guiding J wire.  A 110 King rear pigtail catheter was used to measure  pressures in the ascending aorta and in the left ventricle both prior to and  following the  ventriculogram.  A 30 degree RAO cine left ventriculogram was  performed utilizing the power injector.  Thallium sublingual administration  of 0.4 mg nitroglycerin cine angiography of each coronary artery was  conducted in multiple LAO and RAO projections.  All catheter manipulations  were performed using fluoroscopic observation and exchanges performed over  long guiding J wire.  At the completion of procedure the catheter and  catheter sheath were removed.  Hemostasis was achieved by use of the  Perclose system as noted above.  This was placed under sterile conditions.  She was then transported to the recovery area in stable condition and an  intact distal pulse.   HEMODYNAMICS:  1. Systolic arterial pressure was 122/69 with a mean of 93 mmHg.  There was     no systolic radiating across the aortic valve.  2. The left ventricular end diastolic pressure was 16 mmHg pre and post     ventriculogram.   LEFT VENTRICULOGRAPHY:  1. The left ventriculogram demonstrated normal chamber size  and normal     global systolic function with a visually estimated ejection fraction of     65-70%.  2. There was no mitral regurgitation.  3. There was no significant coronary calcification seen.  4. There were no regional wall motion abnormalities.   CORONARY ANGIOGRAPHY:  1. There was a right dominant coronary system present.  2. The main left coronary artery was short and normal.  3. The left anterior descending artery and its branches were normal.  The     first diagonal branch was small; the second diagonal branch is large.     The ongoing anterior descending artery reaches but barely traverses the     apex.  No obstructions or even luminal irregularities are seen within     this vessel.  4. The left circumflex coronary artery and its branches were normal.  There     is a very small first marginal branch.  The second marginal branch is     large and bifurcates on the medial lateral wall of the  heart.  It does     reach the apex.  The ongoing left circumflex gives rise to a small     posterior lateral branch.  Again, no significant obstructions are seen in     this vessel and no luminal irregularities.  5. The right coronary artery and its branches again were without significant     obstruction.  The vessel may contain a 20% narrowing in the proximal     segment but the catheter was relatively well intubated and the     irregularity there may be due to the curvature of the catheter only.     Proximal, mid and distal portions of the right coronary are smooth and     without any obstruction whatsoever.  The vessel then bifurcates in to the     posterior descending artery and the posterolateral segment.  The     posterior descending artery is large and it self bifurcates within 2 cm     of the origin.  There are no significant obstructions seen.  The     posterolateral segment gives rise to three left ventricular branches, the     first two of which are quite small, the third is moderate in size.     Again, no obstruction seen in the venous portion of the vessel.  6. Collateral vessels are not seen.   FEMORAL ARTERIOGRAPHY:  A left femoral artery angiogram was obtained in the  RAO projection right hand injection.  It documented the femoral artery  widely patent and coursing in normal fashion before bifurcating in to the  profunda femoris and the superficial femoral artery.  Again, no obstructions  are seen within the femoral artery and it is a good size vessel at least 6-7  mm in diameter.  It was judged to be suitable for the use of Perclose.   IMPRESSION:  1. Normal left ventricular systolic size and systolic function.  2. Normal coronary arteries.  3. Noncardiac chest pain.    PLAN:  The patient was presented with this gratifying news.  She will be  discharged from the outpatient center within one hour, provided no complications arise.  I will initiate treatment with proton  pump inhibitor  for presumed esophagitis and gastroesophageal reflux disease.  She should  follow up at her earliest convenience with Dr. Aundria Rud.  Francisca December, M.D.    JHE/MEDQ  D:  11/10/2002  T:  11/10/2002  Job:  [id number]   cc:   Francisca December, M.D.  [address]  [address]  [place]  [place] [address]  Fax: [phone]   Lilyan Punt. Sydnee Levans, M.D.  [address] [place]  [place] [address]  Fax: [phone]   Cardiac Catheterization Lab

## 2011-03-24 NOTE — Discharge Summary (Signed)
   NAME:  Anita Moore, Anita Moore                   ACCOUNT NO.:  [id number]   MEDICAL RECORD NO.:  [id number]                   PATIENT TYPE:  INP   LOCATION:  2899                                 FACILITY:  [facility]   PHYSICIAN:  Tara C. Jernejcic, P.A.             DATE OF BIRTH:  October 05, 1941   DATE OF ADMISSION:  11/09/2002  DATE OF DISCHARGE:                                 DISCHARGE SUMMARY   This is an addendum to discharge summary [phone].   Dr. Amil Amen has actually given her a prescription for Prilosec 20 mg one  p.o. q.d. so she will not be taking Protonix.                                               Luan Moore, P.A.    TCJ/MEDQ  D:  11/10/2002  T:  11/10/2002  Job:  [id number]

## 2011-04-04 ENCOUNTER — Other Ambulatory Visit: Payer: Self-pay | Admitting: Family Medicine

## 2011-04-05 ENCOUNTER — Other Ambulatory Visit: Payer: Self-pay | Admitting: Family Medicine

## 2011-04-05 DIAGNOSIS — N644 Mastodynia: Secondary | ICD-10-CM

## 2011-04-10 ENCOUNTER — Ambulatory Visit
Admission: RE | Admit: 2011-04-10 | Discharge: 2011-04-10 | Disposition: A | Discharge status: Home / Self Care | Payer: Medicare Other | Source: Ambulatory Visit | Attending: Family Medicine | Admitting: Family Medicine

## 2011-04-10 DIAGNOSIS — N644 Mastodynia: Secondary | ICD-10-CM

## 2011-05-01 ENCOUNTER — Ambulatory Visit: Payer: Medicare Other | Admitting: Family Medicine

## 2011-06-10 ENCOUNTER — Emergency Department (HOSPITAL_COMMUNITY)
Admission: EM | Admit: 2011-06-10 | Discharge: 2011-06-10 | Disposition: A | Discharge status: Home / Self Care | Payer: Medicare Other | Attending: Emergency Medicine | Admitting: Emergency Medicine

## 2011-06-10 DIAGNOSIS — S81009A Unspecified open wound, unspecified knee, initial encounter: Secondary | ICD-10-CM | POA: Insufficient documentation

## 2011-06-10 DIAGNOSIS — S91009A Unspecified open wound, unspecified ankle, initial encounter: Secondary | ICD-10-CM | POA: Insufficient documentation

## 2011-06-10 DIAGNOSIS — W010XXA Fall on same level from slipping, tripping and stumbling without subsequent striking against object, initial encounter: Secondary | ICD-10-CM | POA: Insufficient documentation

## 2011-06-18 ENCOUNTER — Emergency Department (HOSPITAL_COMMUNITY)
Admission: EM | Admit: 2011-06-18 | Discharge: 2011-06-18 | Disposition: A | Discharge status: Home / Self Care | Payer: Medicare Other | Attending: Emergency Medicine | Admitting: Emergency Medicine

## 2011-06-18 DIAGNOSIS — Z4802 Encounter for removal of sutures: Secondary | ICD-10-CM | POA: Insufficient documentation

## 2012-10-23 ENCOUNTER — Encounter (HOSPITAL_COMMUNITY): Payer: Self-pay | Admitting: Pharmacy Technician

## 2012-10-24 NOTE — Progress Notes (Signed)
Dr. Charlann Boxer: we need orders put into EPIC on Anita Moore please surg is 11/05/12 and pt coming for preop 10/28/12 thank you

## 2012-10-28 ENCOUNTER — Ambulatory Visit (HOSPITAL_COMMUNITY)
Admission: RE | Admit: 2012-10-28 | Discharge: 2012-10-28 | Disposition: A | Discharge status: Home / Self Care | Payer: Medicare Other | Source: Ambulatory Visit | Attending: Orthopedic Surgery | Admitting: Orthopedic Surgery

## 2012-10-28 ENCOUNTER — Encounter (HOSPITAL_COMMUNITY): Payer: Self-pay

## 2012-10-28 ENCOUNTER — Encounter (HOSPITAL_COMMUNITY)
Admission: RE | Admit: 2012-10-28 | Discharge: 2012-10-28 | Disposition: A | Discharge status: Home / Self Care | Payer: Medicare Other | Source: Ambulatory Visit | Attending: Orthopedic Surgery | Admitting: Orthopedic Surgery

## 2012-10-28 DIAGNOSIS — Z01818 Encounter for other preprocedural examination: Secondary | ICD-10-CM | POA: Insufficient documentation

## 2012-10-28 HISTORY — DX: Unspecified osteoarthritis, unspecified site: M19.90

## 2012-10-28 HISTORY — DX: Cardiac arrhythmia, unspecified: I49.9

## 2012-10-28 LAB — CBC
HCT: 43.8 % (ref 36.0–46.0)
Hemoglobin: 14.8 g/dL (ref 12.0–15.0)
RDW: 13.5 % (ref 11.5–15.5)
WBC: 6.1 10*3/uL (ref 4.0–10.5)

## 2012-10-28 LAB — URINALYSIS, ROUTINE W REFLEX MICROSCOPIC
Ketones, ur: NEGATIVE mg/dL
Nitrite: NEGATIVE
Specific Gravity, Urine: 1.029 (ref 1.005–1.030)
pH: 5.5 (ref 5.0–8.0)

## 2012-10-28 LAB — BASIC METABOLIC PANEL
Chloride: 105 mEq/L (ref 96–112)
GFR calc Af Amer: >90 mL/min (ref >90)
Potassium: 4 mEq/L (ref 3.5–5.1)
Sodium: 138 mEq/L (ref 135–145)

## 2012-10-28 LAB — URINE MICROSCOPIC-ADD ON

## 2012-10-28 LAB — PROTIME-INR: INR: 0.94 (ref 0.00–1.49)

## 2012-10-28 LAB — APTT: aPTT: 29 seconds (ref 24–37)

## 2012-10-28 MED ORDER — CHLORHEXIDINE GLUCONATE 4 % EX LIQD
60.0000 mL | Freq: Once | CUTANEOUS | Status: DC
  Filled 2012-10-28: qty 60

## 2012-10-28 NOTE — Patient Instructions (Addendum)
20 Anita Moore  10/28/2012   Your procedure is scheduled on:  Tuesday, November 05, 2012   Report to [facility] at  AM. 0800  Call this number if you have problems the morning of surgery: [phone]   Remember:   Do not drink liquids or  eat food:After Midnight.[phone] Monday night     Take these medicines the morning of surgery with A SIP OF WATER: None  Do not wear jewelry, make-up or nail polish.  Do not wear lotions, powders, or perfumes. You may wear deodorant.  Do not shave 48 hours prior to surgery. Men may shave face and neck.  Do not bring valuables to the hospital.  Contacts, dentures or bridgework may not be worn into surgery.  Leave suitcase in the car. After surgery it may be brought to your room.  For patients admitted to the hospital, checkout time is 11:00 AM the day of discharge.   Patients discharged the day of surgery will not be allowed to drive home.  Name and phone number of your driver: daughter Anita Moore [phone]  See [facility] Preparing for surgery sheet.   Please read over the following fact sheets that you were given: MRSA Information,Incentive spirometry,Blood transfusion                          Pt notified surgery time moved to 12:10pm tomorrow  12/31 and she needs to arrive to Short Stay Center by 9:30 am.  No food after midnight tonight - but may have water to drink until 6:00 am tomorrow--nothing after 6 am.

## 2012-10-28 NOTE — Progress Notes (Signed)
71 Faxed Dr. Charlann Boxer urine results for ua and ua microscpic,culture not back yet

## 2012-10-29 LAB — URINE CULTURE: Colony Count: 25000

## 2012-10-31 NOTE — Progress Notes (Signed)
URINE CULTURE REPORT FAXED TO DR. Nilsa Nutting OFFICE.

## 2012-10-31 NOTE — H&P (Signed)
TOTAL KNEE ADMISSION H&P  Patient is being admitted for right total knee arthroplasty.  Subjective:  Chief Complaint:   Right knee OA / pain.  HPI: Anita Moore, 71 y.o. female, has a history of pain and functional disability in the right knee due to arthritis and has failed non-surgical conservative treatments for greater than 12 weeks to includeNSAID's and/or analgesics, corticosteriod injections and activity modification.  Onset of symptoms was gradual, starting 3 years ago with gradually worsening course since that time. The patient noted no past surgery on the right knee(s).  Patient currently rates pain in the right knee(s) at 9 out of 10 with activity. Patient has night pain, worsening of pain with activity and weight bearing, pain that interferes with activities of daily living, pain with passive range of motion and crepitus.  Patient has evidence of periarticular osteophytes and joint space narrowing by imaging studies. There is no active infection. Risks, benefits and expectations were discussed with the patient. Patient understand the risks, benefits and expectations and wishes to proceed with surgery.   D/C Plans:  Home with HHPT/SNF/Rehab  Post-op Meds:    Rx given for ASA, Robaxin, Iron, Colace and MiraLax  Tranexamic Acid:   To be given  Decadron:   To be given   FYI:  On metoprolol daily starting 3 days prior to surgery and is to continue 3 days after surgery, per her medical doctor for irregular heart beat she has had previously.   Patient Active Problem List   Diagnosis Date Noted  . SHOULDER PAIN, BILATERAL 01/11/2011  . LUMBAGO 01/11/2011  . SCIATICA, LEFT 09/15/2009  . DECREASED HEARING, RIGHT EAR 04/13/2008  . OTITIS MEDIA, ACUTE 03/02/2008  . ACUTE MAXILLARY SINUSITIS 03/02/2008  . Pain in Soft Tissues of Limb 06/24/2007  . INSOMNIA, PERSISTENT 05/23/2007  . OTITIS EXTERNA, ACUTE NEC 05/23/2007   Past Medical History  Diagnosis Date  . Insomnia   .  Bilateral shoulder pain   . Dysrhythmia 2010    SVT  . Arthritis 1990    osteoarthris knees    Past Surgical History  Procedure Date  . Excisional hemorrhoidectomy 1970  . Cholecystectomy 1985  . Tonsillectomy     as child    No prescriptions prior to admission   Allergies  Allergen Reactions  . Codeine Other (See Comments)    CHEST PAIN   . Other     Rum chest pain and caused blindness    History  Substance Use Topics  . Smoking status: Never Smoker   . Smokeless tobacco: Never Used  . Alcohol Use: No    Family History  Problem Relation Age of Onset  . Heart attack Mother   . Diabetes Neg Hx   . Hypertension Neg Hx      Review of Systems  Constitutional: Negative.   HENT: Negative.   Eyes: Negative.   Respiratory: Negative.   Cardiovascular: Negative.   Gastrointestinal: Negative.   Genitourinary: Negative.   Musculoskeletal: Positive for joint pain.  Skin: Negative.   Neurological: Negative.   Endo/Heme/Allergies: Negative.   Psychiatric/Behavioral: Negative.     Objective:  Physical Exam  Constitutional: She is oriented to person, place, and time. She appears well-developed and well-nourished.  HENT:  Head: Normocephalic and atraumatic.  Mouth/Throat: Oropharynx is clear and moist.  Eyes: Pupils are equal, round, and reactive to light.  Neck: Neck supple. No JVD present. No tracheal deviation present. No thyromegaly present.  Cardiovascular: Normal rate, regular rhythm, normal heart  sounds and intact distal pulses.   Respiratory: Effort normal and breath sounds normal. No stridor. No respiratory distress. She has no wheezes.  GI: Soft. There is no tenderness. There is no guarding.  Lymphadenopathy:    She has no cervical adenopathy.  Neurological: She is alert and oriented to person, place, and time.  Skin: Skin is warm and dry.  Psychiatric: She has a normal mood and affect.     Labs:  Estimated Body mass index is 40.34 kg/(m^2) as  calculated from the following:   Height as of 03/20/11: 5\' 4" (1.626 m).   Weight as of 03/20/11: 235 lb(106.595 kg).   Imaging Review Plain radiographs demonstrate severe degenerative joint disease of the right knee(s). The overall alignment isneutral. The bone quality appears to be good for age and reported activity level.  Assessment/Plan:  End stage arthritis, right knee   The patient history, physical examination, clinical judgment of the provider and imaging studies are consistent with end stage degenerative joint disease of the right knee(s) and total knee arthroplasty is deemed medically necessary. The treatment options including medical management, injection therapy arthroscopy and arthroplasty were discussed at length. The risks and benefits of total knee arthroplasty were presented and reviewed. The risks due to aseptic loosening, infection, stiffness, patella tracking problems, thromboembolic complications and other imponderables were discussed. The patient acknowledged the explanation, agreed to proceed with the plan and consent was signed. Patient is being admitted for inpatient treatment for surgery, pain control, PT, OT, prophylactic antibiotics, VTE prophylaxis, progressive ambulation and ADL's and discharge planning. The patient is planning to be discharged home with home health services.     Anastasio Auerbach Kenwood Rosiak   PAC  10/31/2012, 3:31 PM

## 2012-11-05 ENCOUNTER — Encounter (HOSPITAL_COMMUNITY)
Admission: RE | Disposition: A | Discharge status: Home Health | Payer: Self-pay | Source: Ambulatory Visit | Attending: Orthopedic Surgery

## 2012-11-05 ENCOUNTER — Encounter (HOSPITAL_COMMUNITY): Payer: Self-pay | Admitting: *Deleted

## 2012-11-05 ENCOUNTER — Encounter (HOSPITAL_COMMUNITY): Payer: Self-pay | Admitting: Anesthesiology

## 2012-11-05 ENCOUNTER — Inpatient Hospital Stay (HOSPITAL_COMMUNITY)
Admission: RE | Admit: 2012-11-05 | Discharge: 2012-11-06 | DRG: 470 | Disposition: A | Discharge status: Home Health | Payer: Medicare Other | Source: Ambulatory Visit | Attending: Orthopedic Surgery | Admitting: Orthopedic Surgery

## 2012-11-05 ENCOUNTER — Inpatient Hospital Stay (HOSPITAL_COMMUNITY): Payer: Medicare Other | Admitting: Anesthesiology

## 2012-11-05 DIAGNOSIS — I498 Other specified cardiac arrhythmias: Secondary | ICD-10-CM | POA: Diagnosis present

## 2012-11-05 DIAGNOSIS — Z96659 Presence of unspecified artificial knee joint: Secondary | ICD-10-CM

## 2012-11-05 DIAGNOSIS — M25519 Pain in unspecified shoulder: Secondary | ICD-10-CM | POA: Diagnosis present

## 2012-11-05 DIAGNOSIS — G4700 Insomnia, unspecified: Secondary | ICD-10-CM | POA: Diagnosis present

## 2012-11-05 DIAGNOSIS — M1710 Unilateral primary osteoarthritis, unspecified knee: Principal | ICD-10-CM | POA: Diagnosis present

## 2012-11-05 HISTORY — PX: TOTAL KNEE ARTHROPLASTY: SHX125

## 2012-11-05 LAB — ABO/RH: ABO/RH(D): A POS

## 2012-11-05 LAB — TYPE AND SCREEN

## 2012-11-05 SURGERY — ARTHROPLASTY, KNEE, TOTAL
Anesthesia: Spinal | Site: Knee | Laterality: Right | Wound class: Clean

## 2012-11-05 MED ORDER — PHENOL 1.4 % MT LIQD: 1.0000 | OROMUCOSAL | Status: DC | PRN

## 2012-11-05 MED ORDER — CELECOXIB 200 MG PO CAPS
200.0000 mg | ORAL_CAPSULE | Freq: Two times a day (BID) | ORAL | Status: DC
  Administered 2012-11-06: 200 mg via ORAL
  Filled 2012-11-05 (×3): qty 1

## 2012-11-05 MED ORDER — LACTATED RINGERS IV SOLN
INTRAVENOUS | Status: DC
  Administered 2012-11-05 (×2): via INTRAVENOUS

## 2012-11-05 MED ORDER — FENTANYL CITRATE 0.05 MG/ML IJ SOLN
INTRAMUSCULAR | Status: DC | PRN
  Administered 2012-11-05: 50 ug via INTRAVENOUS

## 2012-11-05 MED ORDER — FERROUS SULFATE 325 (65 FE) MG PO TABS
325.0000 mg | ORAL_TABLET | Freq: Three times a day (TID) | ORAL | Status: DC
  Administered 2012-11-05 – 2012-11-06 (×3): 325 mg via ORAL
  Filled 2012-11-05 (×5): qty 1

## 2012-11-05 MED ORDER — PROMETHAZINE HCL 25 MG/ML IJ SOLN: 6.2500 mg | INTRAMUSCULAR | Status: DC | PRN

## 2012-11-05 MED ORDER — FLEET ENEMA 7-19 GM/118ML RE ENEM: 1.0000 | ENEMA | Freq: Once | RECTAL | Status: AC | PRN

## 2012-11-05 MED ORDER — TRANEXAMIC ACID 100 MG/ML IV SOLN
15.0000 mg/kg | Freq: Once | INTRAVENOUS | Status: AC
  Administered 2012-11-05: 1634 mg via INTRAVENOUS
  Filled 2012-11-05: qty 16.34

## 2012-11-05 MED ORDER — 0.9 % SODIUM CHLORIDE (POUR BTL) OPTIME
TOPICAL | Status: DC | PRN
  Administered 2012-11-05: 1000 mL

## 2012-11-05 MED ORDER — PROPOFOL INFUSION 10 MG/ML OPTIME
INTRAVENOUS | Status: DC | PRN
  Administered 2012-11-05: 120 ug/kg/min via INTRAVENOUS

## 2012-11-05 MED ORDER — METHOCARBAMOL 100 MG/ML IJ SOLN
500.0000 mg | Freq: Four times a day (QID) | INTRAVENOUS | Status: DC | PRN
  Administered 2012-11-05: 500 mg via INTRAVENOUS
  Filled 2012-11-05: qty 5

## 2012-11-05 MED ORDER — ZOLPIDEM TARTRATE 5 MG PO TABS: 5.0000 mg | ORAL_TABLET | Freq: Every evening | ORAL | Status: DC | PRN

## 2012-11-05 MED ORDER — BUPIVACAINE HCL (PF) 0.75 % IJ SOLN
INTRAMUSCULAR | Status: DC | PRN
  Administered 2012-11-05: 13 mg

## 2012-11-05 MED ORDER — KETOROLAC TROMETHAMINE 30 MG/ML IJ SOLN
INTRAMUSCULAR | Status: DC | PRN
  Administered 2012-11-05: 30 mg via INTRAVENOUS

## 2012-11-05 MED ORDER — PROPOFOL 10 MG/ML IV BOLUS
INTRAVENOUS | Status: DC | PRN
  Administered 2012-11-05 (×2): 20 mg via INTRAVENOUS

## 2012-11-05 MED ORDER — ACETAMINOPHEN 10 MG/ML IV SOLN
INTRAVENOUS | Status: DC | PRN
  Administered 2012-11-05: 1000 mg via INTRAVENOUS

## 2012-11-05 MED ORDER — RIVAROXABAN 10 MG PO TABS
10.0000 mg | ORAL_TABLET | ORAL | Status: DC
  Administered 2012-11-06: 10 mg via ORAL
  Filled 2012-11-05 (×2): qty 1

## 2012-11-05 MED ORDER — ONDANSETRON HCL 4 MG/2ML IJ SOLN
INTRAMUSCULAR | Status: DC | PRN
  Administered 2012-11-05: 4 mg via INTRAVENOUS

## 2012-11-05 MED ORDER — SODIUM CHLORIDE 0.9 % IV SOLN
INTRAVENOUS | Status: DC
  Administered 2012-11-05 – 2012-11-06 (×2): via INTRAVENOUS
  Filled 2012-11-05 (×5): qty 1000

## 2012-11-05 MED ORDER — ONDANSETRON HCL 4 MG/2ML IJ SOLN
4.0000 mg | Freq: Four times a day (QID) | INTRAMUSCULAR | Status: DC | PRN
  Administered 2012-11-05: 4 mg via INTRAVENOUS
  Filled 2012-11-05 (×2): qty 2

## 2012-11-05 MED ORDER — MECLIZINE HCL 25 MG PO TABS
25.0000 mg | ORAL_TABLET | Freq: Three times a day (TID) | ORAL | Status: DC | PRN
  Administered 2012-11-05: 25 mg via ORAL
  Filled 2012-11-05: qty 1

## 2012-11-05 MED ORDER — CEFAZOLIN SODIUM-DEXTROSE 2-3 GM-% IV SOLR
2.0000 g | Freq: Four times a day (QID) | INTRAVENOUS | Status: AC
  Administered 2012-11-05 (×2): 2 g via INTRAVENOUS
  Filled 2012-11-05 (×2): qty 50

## 2012-11-05 MED ORDER — POLYETHYLENE GLYCOL 3350 17 G PO PACK: 17.0000 g | PACK | Freq: Two times a day (BID) | ORAL | Status: DC

## 2012-11-05 MED ORDER — MEPERIDINE HCL 50 MG/ML IJ SOLN: 6.2500 mg | INTRAMUSCULAR | Status: DC | PRN

## 2012-11-05 MED ORDER — METHOCARBAMOL 500 MG PO TABS
500.0000 mg | ORAL_TABLET | Freq: Four times a day (QID) | ORAL | Status: DC | PRN
  Administered 2012-11-06: 500 mg via ORAL
  Filled 2012-11-05: qty 1

## 2012-11-05 MED ORDER — DEXAMETHASONE SODIUM PHOSPHATE 10 MG/ML IJ SOLN: 10.0000 mg | Freq: Once | INTRAMUSCULAR | Status: DC

## 2012-11-05 MED ORDER — HYDROCODONE-ACETAMINOPHEN 7.5-325 MG PO TABS
1.0000 | ORAL_TABLET | ORAL | Status: DC
  Administered 2012-11-06: 1 via ORAL
  Administered 2012-11-06 (×2): 2 via ORAL
  Filled 2012-11-05: qty 1
  Filled 2012-11-05 (×2): qty 2

## 2012-11-05 MED ORDER — BISACODYL 10 MG RE SUPP: 10.0000 mg | Freq: Every day | RECTAL | Status: DC | PRN

## 2012-11-05 MED ORDER — DIPHENHYDRAMINE HCL 25 MG PO CAPS: 25.0000 mg | ORAL_CAPSULE | Freq: Four times a day (QID) | ORAL | Status: DC | PRN

## 2012-11-05 MED ORDER — DEXAMETHASONE SODIUM PHOSPHATE 10 MG/ML IJ SOLN
INTRAMUSCULAR | Status: DC | PRN
  Administered 2012-11-05: 10 mg via INTRAVENOUS

## 2012-11-05 MED ORDER — DEXAMETHASONE SODIUM PHOSPHATE 10 MG/ML IJ SOLN
10.0000 mg | Freq: Once | INTRAMUSCULAR | Status: AC
  Administered 2012-11-06: 10 mg via INTRAVENOUS
  Filled 2012-11-05: qty 1

## 2012-11-05 MED ORDER — MIDAZOLAM HCL 5 MG/5ML IJ SOLN
INTRAMUSCULAR | Status: DC | PRN
  Administered 2012-11-05: 2 mg via INTRAVENOUS

## 2012-11-05 MED ORDER — HYDROMORPHONE HCL PF 1 MG/ML IJ SOLN: 0.5000 mg | INTRAMUSCULAR | Status: DC | PRN

## 2012-11-05 MED ORDER — MENTHOL 3 MG MT LOZG: 1.0000 | LOZENGE | OROMUCOSAL | Status: DC | PRN

## 2012-11-05 MED ORDER — ONDANSETRON HCL 4 MG PO TABS: 4.0000 mg | ORAL_TABLET | Freq: Four times a day (QID) | ORAL | Status: DC | PRN

## 2012-11-05 MED ORDER — METOPROLOL SUCCINATE ER 25 MG PO TB24
25.0000 mg | ORAL_TABLET | Freq: Every day | ORAL | Status: DC
  Administered 2012-11-05: 25 mg via ORAL
  Filled 2012-11-05 (×2): qty 1

## 2012-11-05 MED ORDER — CEFAZOLIN SODIUM-DEXTROSE 2-3 GM-% IV SOLR
2.0000 g | INTRAVENOUS | Status: AC
  Administered 2012-11-05: 2 g via INTRAVENOUS

## 2012-11-05 MED ORDER — BUPIVACAINE-EPINEPHRINE PF 0.25-1:200000 % IJ SOLN
INTRAMUSCULAR | Status: DC | PRN
  Administered 2012-11-05: 60 mL

## 2012-11-05 MED ORDER — DOCUSATE SODIUM 100 MG PO CAPS
100.0000 mg | ORAL_CAPSULE | Freq: Two times a day (BID) | ORAL | Status: DC
  Administered 2012-11-06: 100 mg via ORAL

## 2012-11-05 MED ORDER — HYDROMORPHONE HCL PF 1 MG/ML IJ SOLN
0.2500 mg | INTRAMUSCULAR | Status: DC | PRN
  Administered 2012-11-05 (×4): 0.5 mg via INTRAVENOUS

## 2012-11-05 MED ORDER — ALUM & MAG HYDROXIDE-SIMETH 200-200-20 MG/5ML PO SUSP: 30.0000 mL | ORAL | Status: DC | PRN

## 2012-11-05 MED ORDER — LACTATED RINGERS IV SOLN: INTRAVENOUS | Status: DC

## 2012-11-05 MED ORDER — LIDOCAINE HCL (CARDIAC) 20 MG/ML IV SOLN
INTRAVENOUS | Status: DC | PRN
  Administered 2012-11-05: 30 mg via INTRAVENOUS

## 2012-11-05 SURGICAL SUPPLY — 55 items

## 2012-11-05 NOTE — Transfer of Care (Signed)
Immediate Anesthesia Transfer of Care Note  Patient: Anita Moore  Procedure(s) Performed: Procedure(s) (LRB) with comments: TOTAL KNEE ARTHROPLASTY (Right) - right   Patient Location: PACU  Anesthesia Type:Spinal  Level of Consciousness: awake, alert , oriented and patient cooperative  Airway & Oxygen Therapy: Patient Spontanous Breathing and Patient connected to face mask oxygen  Post-op Assessment: Report given to PACU RN and Post -op Vital signs reviewed and stable  Post vital signs: stable  Complications: No apparent anesthesia complications  S1 spinal level

## 2012-11-05 NOTE — Interval H&P Note (Signed)
History and Physical Interval Note:  11/05/2012 10:50 AM  Anita Moore  has presented today for surgery, with the diagnosis of Osteoarthritis of the Right Knee  The various methods of treatment have been discussed with the patient and family. After consideration of risks, benefits and other options for treatment, the patient has consented to  Procedure(s) (LRB) with comments: TOTAL KNEE ARTHROPLASTY (Right) as a surgical intervention .  The patient's history has been reviewed, patient examined, no change in status, stable for surgery.  I have reviewed the patient's chart and labs.  Questions were answered to the patient's satisfaction.     [name]

## 2012-11-05 NOTE — Anesthesia Procedure Notes (Addendum)
Spinal  Start time: 11/05/2012 11:50 AM End time: 11/05/2012 11:55 AM Staffing CRNA/Resident: Paris Lore Performed by: resident/CRNA  Spinal Block Patient position: sitting Prep: Betadine Patient monitoring: heart rate, cardiac monitor, continuous pulse ox and blood pressure Approach: right paramedian Location: L2-3 Injection technique: single-shot Needle Needle length: 9 cm Needle insertion depth: 6 cm Assessment Sensory level: T4 Additional Notes Expiration date checked.  Sitting position X 1 attempt with clear CSF return easy administration of medication.

## 2012-11-05 NOTE — Transfer of Care (Deleted)
Immediate Anesthesia Transfer of Care Note  Patient: Anita Moore  Procedure(s) Performed: Procedure(s) (LRB) with comments: TOTAL KNEE ARTHROPLASTY (Right) - right   Patient Location: PACU  Anesthesia Type:Spinal  Level of Consciousness: awake, alert , oriented and patient cooperative  Airway & Oxygen Therapy: Patient Spontanous Breathing and Patient connected to face mask oxygen  Post-op Assessment: Report given to PACU RN  Post vital signs: stable  Complications: No apparent anesthesia complications  S1 spinal level

## 2012-11-05 NOTE — Progress Notes (Signed)
Utilization review completed.  

## 2012-11-05 NOTE — Anesthesia Preprocedure Evaluation (Addendum)
Anesthesia Evaluation  Patient identified by MRN, date of birth, ID band Patient awake    Reviewed: Allergy & Precautions, H&P , NPO status , Patient's Chart, lab work & pertinent test results  Airway Mallampati: II TM Distance: >3 FB Neck ROM: Full    Dental No notable dental hx.    Pulmonary neg pulmonary ROS,  breath sounds clear to auscultation  Pulmonary exam normal       Cardiovascular + dysrhythmias Supra Ventricular Tachycardia Rhythm:Regular Rate:Normal     Neuro/Psych negative neurological ROS  negative psych ROS   GI/Hepatic negative GI ROS, Neg liver ROS,   Endo/Other  negative endocrine ROS  Renal/GU negative Renal ROS  negative genitourinary   Musculoskeletal negative musculoskeletal ROS (+)   Abdominal   Peds negative pediatric ROS (+)  Hematology negative hematology ROS (+)   Anesthesia Other Findings Upper front caps  Reproductive/Obstetrics negative OB ROS                         Anesthesia Physical Anesthesia Plan  ASA: II  Anesthesia Plan: Spinal   Post-op Pain Management:    Induction:   Airway Management Planned: Simple Face Mask  Additional Equipment:   Intra-op Plan:   Post-operative Plan:   Informed Consent: I have reviewed the patients History and Physical, chart, labs and discussed the procedure including the risks, benefits and alternatives for the proposed anesthesia with the patient or authorized representative who has indicated his/her understanding and acceptance.   Dental advisory given  Plan Discussed with: CRNA  Anesthesia Plan Comments:         Anesthesia Quick Evaluation

## 2012-11-05 NOTE — Op Note (Signed)
NAME:  Anita Moore                      MEDICAL RECORD NO.:  [id number]                             FACILITY:  [facility]      PHYSICIAN:  Madlyn Frankel. Charlann Boxer, M.D.  DATE OF BIRTH:  11/20/1940      DATE OF PROCEDURE:  11/05/2012                                     OPERATIVE REPORT         PREOPERATIVE DIAGNOSIS:  Right knee osteoarthritis.      POSTOPERATIVE DIAGNOSIS:  Right knee osteoarthritis.      FINDINGS:  The patient was noted to have complete loss of cartilage and   bone-on-bone arthritis with associated osteophytes predominantly in the medial but also involving the patellofemoral compartments of   the knee with a significant synovitis and associated effusion.      PROCEDURE:  Right total knee replacement.      COMPONENTS USED:  DePuy rotating platform posterior stabilized knee   system, a size 4N femur, 3 tibia, 12.5 mm insert, and 38 patellar   button.      SURGEON:  Madlyn Frankel. Charlann Boxer, M.D.      ASSISTANT:  Lanney Gins, PA-C.      ANESTHESIA:  Spinal.      SPECIMENS:  None.      COMPLICATION:  None.      DRAINS:  One Hemovac.  EBL: <150cc      TOURNIQUET TIME:   Total Tourniquet Time Documented: Thigh (Right) - 32 minutes .      The patient was stable to the recovery room.      INDICATION FOR PROCEDURE:  Anita Moore is a 71 y.o. female patient of   mine.  The patient had been seen, evaluated, and treated conservatively in the   office with medication, activity modification, and injections.  The patient had   radiographic changes of bone-on-bone arthritis with endplate sclerosis and osteophytes noted.      The patient failed conservative measures including medication, injections, and activity modification, and at this point was ready for more definitive measures.   Based on the radiographic changes and failed conservative measures, the patient   decided to proceed with total knee replacement.  Risks of infection,   DVT, component failure,  need for revision surgery, postop course, and   expectations were all   discussed and reviewed.  Consent was obtained for benefit of pain   relief.      PROCEDURE IN DETAIL:  The patient was brought to the operative theater.   Once adequate anesthesia, preoperative antibiotics, 2 gm of Ancef administered, the patient was positioned supine with the right thigh tourniquet placed.  The  right lower extremity was prepped and draped in sterile fashion.  A time-   out was performed identifying the patient, planned procedure, and   extremity.      The right lower extremity was placed in the [facility] leg holder.  The leg was   exsanguinated, tourniquet elevated to 250 mmHg.  A midline incision was   made followed by median parapatellar arthrotomy.  Following initial   exposure, attention was first directed to the  patella.  Precut   measurement was noted to be 22 mm.  I resected down to 14 mm and used a   38 patellar button to restore patellar height as well as cover the cut   surface.      The lug holes were drilled and a metal shim was placed to protect the   patella from retractors and saw blades.      At this point, attention was now directed to the femur.  The femoral   canal was opened with a drill, irrigated to try to prevent fat emboli.  An   intramedullary rod was passed at 3 degrees valgus, 10 mm of bone was   resected off the distal femur.  Following this resection, the tibia was   subluxated anteriorly.  Using the extramedullary guide, 10 mm of bone was resected off   the proximal lateral tibia.  We confirmed the gap would be   stable medially and laterally with a 10 mm insert as well as confirmed   the cut was perpendicular in the coronal plane, checking with an alignment rod.      Once this was done, I sized the femur to be a size 4 in the anterior-   posterior dimension, chose a narrow component based on medial and   lateral dimension.  The size 4 rotation block was then pinned  in   position anterior referenced using the C-clamp to set rotation.  The   anterior, posterior, and  chamfer cuts were made without difficulty nor   notching making certain that I was along the anterior cortex to help   with flexion gap stability.      The final box cut was made off the lateral aspect of distal femur.      At this point, the tibia was sized to be a size 3, the size 3 tray was   then pinned in position through the medial third of the tubercle,   drilled, and keel punched.  Trial reduction was now carried with a 4 N femur,  3 tibia, a 12.5 mm insert, and the 38 patella botton.  The knee was brought to   extension, full extension with good flexion stability with the patella   tracking through the trochlea without application of pressure.  Given   all these findings, the trial components removed.  Final components were   opened and cement was mixed.  The knee was irrigated with normal saline   solution and pulse lavage.  The synovial lining was   then injected with 0.25% Marcaine with epinephrine and 1 cc of Toradol,   total of 61 cc.      The knee was irrigated.  Final implants were then cemented onto clean and   dried cut surfaces of bone with the knee brought to extension with a 12.5 mm trial insert.      Once the cement had fully cured, the excess cement was removed   throughout the knee.  I confirmed I was satisfied with the range of   motion and stability, and the final 12.5 mm PS insert was chosen.  It was   placed into the knee.      The tourniquet had been let down at 32 minutes.  No significant   hemostasis required.  The medium Hemovac drain was placed deep.  The   extensor mechanism was then reapproximated using #1 Vicryl with the knee   in flexion.  The   remaining wound  was closed with 2-0 Vicryl and running 4-0 Monocryl.   The knee was cleaned, dried, dressed sterilely using Dermabond and   Aquacel dressing.  Drain site dressed separately.  The patient  was then   brought to recovery room in stable condition, tolerating the procedure   well.   Please note that Physician Assistant, Lanney Gins, was present for the entirety of the case, and was utilized for pre-operative positioning, peri-operative retractor management, general facilitation of the procedure.  He was also utilized for primary wound closure at the end of the case.              Madlyn Frankel Charlann Boxer, M.D.

## 2012-11-06 LAB — CBC
MCV: 87.2 fL (ref 78.0–100.0)
Platelets: 254 10*3/uL (ref 150–400)
RDW: 13.6 % (ref 11.5–15.5)
WBC: 12.1 10*3/uL — ABNORMAL HIGH (ref 4.0–10.5)

## 2012-11-06 LAB — BASIC METABOLIC PANEL
Chloride: 103 mEq/L (ref 96–112)
Creatinine, Ser: 0.52 mg/dL (ref 0.50–1.10)
GFR calc Af Amer: >90 mL/min (ref >90)
GFR calc non Af Amer: >90 mL/min (ref >90)
Potassium: 3.7 mEq/L (ref 3.5–5.1)

## 2012-11-06 MED ORDER — ASPIRIN EC 325 MG PO TBEC: 325.0000 mg | DELAYED_RELEASE_TABLET | Freq: Two times a day (BID) | ORAL | Status: DC

## 2012-11-06 MED ORDER — POLYETHYLENE GLYCOL 3350 17 G PO PACK: 17.0000 g | PACK | Freq: Two times a day (BID) | ORAL | Status: DC

## 2012-11-06 MED ORDER — DIPHENHYDRAMINE HCL 25 MG PO CAPS: 25.0000 mg | ORAL_CAPSULE | Freq: Four times a day (QID) | ORAL | Status: DC | PRN

## 2012-11-06 MED ORDER — DSS 100 MG PO CAPS: 100.0000 mg | ORAL_CAPSULE | Freq: Two times a day (BID) | ORAL | Status: DC

## 2012-11-06 MED ORDER — FERROUS SULFATE 325 (65 FE) MG PO TABS: 325.0000 mg | ORAL_TABLET | Freq: Three times a day (TID) | ORAL | Status: DC

## 2012-11-06 MED ORDER — HYDROCODONE-ACETAMINOPHEN 7.5-325 MG PO TABS: 1.0000 | ORAL_TABLET | ORAL | Status: DC | PRN

## 2012-11-06 MED ORDER — METHOCARBAMOL 500 MG PO TABS: 500.0000 mg | ORAL_TABLET | Freq: Four times a day (QID) | ORAL | Status: DC | PRN

## 2012-11-06 NOTE — Evaluation (Signed)
Physical Therapy Evaluation Patient Details Name: Anita Moore MRN: [id number] DOB: January 09, 1941 Today's Date: 11/06/2012 Time: 4098-1191 PT Time Calculation (min): 30 min  PT Assessment / Plan / Recommendation Clinical Impression  Pt s/p R TKA. PT will benefit from skilled PT in the acute care setting in order to maximize functional mobility and safety prior to d/c home. Plan to d/c this afternoon following stairs    PT Assessment  Patient needs continued PT services    Follow Up Recommendations  Home health PT;Supervision/Assistance - 24 hour    Does the patient have the potential to tolerate intense rehabilitation      Barriers to Discharge        Equipment Recommendations  None recommended by PT    Recommendations for Other Services     Frequency 7X/week    Precautions / Restrictions Precautions Precautions: None Restrictions Weight Bearing Restrictions: No   Pertinent Vitals/Pain Pain 4/10. Pain meds given prior to session.       Mobility  Bed Mobility Bed Mobility: Supine to Sit;Sitting - Scoot to Edge of Bed Supine to Sit: 4: Min guard Sitting - Scoot to [employer] of Bed: 4: Min guard Details for Bed Mobility Assistance: VC for proper sequencing for safety. HOB flat Transfers Transfers: Sit to Stand;Stand to Sit Sit to Stand: 4: Min assist;With upper extremity assist;From bed Stand to Sit: 4: Min assist;With upper extremity assist;To chair/3-in-1 Details for Transfer Assistance: VC for safe hand placement to/from RW. Bed raised to simulate bed at home. Min assist for stability Ambulation/Gait Ambulation/Gait Assistance: 4: Min guard Ambulation Distance (Feet): 50 Feet Assistive device: Rolling walker Ambulation/Gait Assistance Details: VC for safe technique with RW including safe distance. Cues for upright posture. Limited knee flexion during swing through Gait Pattern: Step-to pattern;Decreased hip/knee flexion - right;Decreased stance time -  right;Decreased step length - left;Trunk flexed Gait velocity: slow gait speed Stairs: No    Shoulder Instructions     Exercises Total Joint Exercises Ankle Circles/Pumps: AROM;Strengthening;Both;10 reps;Supine Quad Sets: AROM;Strengthening;Right;10 reps;Supine Goniometric ROM: 0-65   PT Diagnosis: Difficulty walking;Acute pain  PT Problem List: Decreased strength;Decreased range of motion;Decreased activity tolerance;Decreased mobility;Decreased knowledge of use of DME;Decreased safety awareness;Decreased knowledge of precautions;Pain PT Treatment Interventions: DME instruction;Gait training;Stair training;Therapeutic activities;Functional mobility training;Therapeutic exercise;Patient/family education   PT Goals Acute Rehab PT Goals PT Goal Formulation: With patient Time For Goal Achievement: 11/13/12 Potential to Achieve Goals: Good Pt will go Supine/Side to Sit: with modified independence PT Goal: Supine/Side to Sit - Progress: Goal set today Pt will go Sit to Supine/Side: with modified independence PT Goal: Sit to Supine/Side - Progress: Goal set today Pt will go Sit to Stand: with supervision PT Goal: Sit to Stand - Progress: Goal set today Pt will go Stand to Sit: with supervision PT Goal: Stand to Sit - Progress: Goal set today Pt will Transfer Bed to Chair/Chair to Bed: with supervision PT Transfer Goal: Bed to Chair/Chair to Bed - Progress: Goal set today Pt will Ambulate: >150 feet;with supervision;with least restrictive assistive device PT Goal: Ambulate - Progress: Goal set today Pt will Go Up / Down Stairs: 1-2 stairs;with min assist;with least restrictive assistive device PT Goal: Up/Down Stairs - Progress: Goal set today  Visit Information  Last PT Received On: 11/06/12 Assistance Needed: +1 PT/OT Co-Evaluation/Treatment: Yes    Subjective Data  Patient Stated Goal: to go home   Prior Functioning  Home Living Lives With: Alone Available Help at Discharge:  Family;Available PRN/intermittently Type of  Home: House Home Access: Stairs to enter [employer] of Steps: 1 Entrance Stairs-Rails: None Home Layout: One level Bathroom Shower/Tub: Walk-in shower;Door [employer] Toilet: Handicapped height Bathroom Accessibility: Yes How Accessible: Accessible via walker Home Adaptive Equipment: Straight cane;Walker - rolling Prior Function Level of Independence: Independent Able to Take Stairs?: Yes Driving: Yes Vocation: Retired Musician: No difficulties Dominant Hand: Right    Cognition  Overall Cognitive Status: Appears within functional limits for tasks assessed/performed Arousal/Alertness: Awake/alert Orientation Level: Appears intact for tasks assessed Behavior During Session: [facility] for tasks performed    Extremity/Trunk Assessment Right Lower Extremity Assessment RLE ROM/Strength/Tone: Deficits RLE ROM/Strength/Tone Deficits: HIP and Ankle WFL. Pt able to complete SLR without assist. RLE Sensation: WFL - Light Touch Left Lower Extremity Assessment LLE ROM/Strength/Tone: Within functional levels LLE Sensation: WFL - Light Touch   Balance    End of Session PT - End of Session Equipment Utilized During Treatment: Gait belt Activity Tolerance: Patient limited by fatigue Patient left: in chair;with call bell/phone within reach;with family/visitor present Nurse Communication: Mobility status  GP     Milana Kidney 11/06/2012, 10:09 AM  11/06/2012 Milana Kidney DPT PAGER: [phone] OFFICE: [phone]

## 2012-11-06 NOTE — Evaluation (Signed)
Occupational Therapy Evaluation and Discharge Patient Details Name: Anita Moore MRN: [id number] DOB: 1940/12/07 Today's Date: 11/06/2012 Time: 4098-1191 OT Time Calculation (min): 30 min  OT Assessment / Plan / Recommendation Clinical Impression  This 72 yo s/p RTKA presents to acute OT with all education completed. Will sign off.    OT Assessment  Patient does not need any further OT services    Follow Up Recommendations  No OT follow up       Equipment Recommendations  None recommended by OT          Precautions / Restrictions Precautions Precautions: None Restrictions Weight Bearing Restrictions: No   Pertinent Vitals/Pain 2/10 knee    ADL  Transfers/Ambulation Related to ADLs: Min guard A ADL Comments: Pt will have her daughter with her except when she has to take her children to school. Daughter will help pt prn for LBB/D. Went over in room simulating shower stall transfer to seat stepping over a rolled up blanket to then sit in a chair. Pt reports she may use a plastic garden chair  in her shower which I said was fine but she may need to put some non-slip gripper under each leg. Also recommended that she get a hand held shower.      Visit Information  Last OT Received On: 11/06/12 Assistance Needed: +1    Subjective Data  Patient Stated Goal: Go home this afternoon   Prior Functioning     Home Living Lives With: Alone Available Help at Discharge: Family;Available PRN/intermittently Type of Home: House Home Access: Stairs to enter [employer] of Steps: 1 Entrance Stairs-Rails: None Home Layout: One level Bathroom Shower/Tub: Walk-in shower;Door [employer] Toilet: Handicapped height Bathroom Accessibility: Yes How Accessible: Accessible via walker Home Adaptive Equipment: Straight cane;Walker - rolling (can borrow a 3n1) Prior Function Level of Independence: Independent Able to Take Stairs?: Yes Driving: Yes Vocation:  Retired Musician: No difficulties Dominant Hand: Right            Cognition  Overall Cognitive Status: Appears within functional limits for tasks assessed/performed Arousal/Alertness: Awake/alert Orientation Level: Appears intact for tasks assessed Behavior During Session: [facility] for tasks performed    Extremity/Trunk Assessment Right Upper Extremity Assessment RUE ROM/Strength/Tone: Within functional levels Left Upper Extremity Assessment LUE ROM/Strength/Tone: Within functional levels Right Lower Extremity Assessment RLE ROM/Strength/Tone: Deficits RLE ROM/Strength/Tone Deficits: HIP and Ankle WFL. Pt able to complete SLR without assist. RLE Sensation: WFL - Light Touch Left Lower Extremity Assessment LLE ROM/Strength/Tone: Within functional levels LLE Sensation: WFL - Light Touch     Mobility Bed Mobility Bed Mobility: Supine to Sit;Sitting - Scoot to Edge of Bed Supine to Sit: 4: Min guard Sitting - Scoot to [employer] of Bed: 4: Min guard Details for Bed Mobility Assistance: VC for proper sequencing for safety. HOB flat Transfers Sit to Stand: 4: Min assist;With upper extremity assist;From bed Stand to Sit: 4: Min assist;With upper extremity assist;To chair/3-in-1 Details for Transfer Assistance: VC for safe hand placement to/from RW. Bed raised to simulate bed at home. Min assist for stability        Exercise Total Joint Exercises Ankle Circles/Pumps: AROM;Strengthening;Both;10 reps;Supine Quad Sets: AROM;Strengthening;Right;10 reps;Supine Goniometric ROM: 0-65      End of Session OT - End of Session Equipment Utilized During Treatment: Gait belt (RW) Activity Tolerance: Patient tolerated treatment well Patient left: in chair;with call bell/phone within reach;with family/visitor present       Anita Moore, Anita Moore [phone] 11/06/2012, 10:25 AM

## 2012-11-06 NOTE — Progress Notes (Signed)
Patient ID: Anita Moore, female   DOB: [birth year], 72 y.o.   MRN: [id number] Subjective: 1 Day Post-Op Procedure(s) (LRB): TOTAL KNEE ARTHROPLASTY (Right)    Patient reports pain as mild.  Surprised at how well she is feeling this am  Objective:   VITALS:   Filed Vitals:   11/06/12 0342  BP: 115/68  Pulse: 71  Temp: 97.9 F (36.6 C)  Resp:     Neurovascular intact Incision: dressing C/D/I  LABS  Basename 11/06/12 0453  HGB 13.1  HCT 38.7  WBC 12.1*  PLT 254     Basename 11/06/12 0453  NA 135  K 3.7  BUN 10  CREATININE 0.52  GLUCOSE 180*    No results found for this basename: LABPT:2,INR:2 in the last 72 hours   Assessment/Plan: 1 Day Post-Op Procedure(s) (LRB): TOTAL KNEE ARTHROPLASTY (Right)   Advance diet Up with therapy Discharge home with home health today after therapy times 2 today if she is doing well Reviewed discharge plan with her daughter

## 2012-11-06 NOTE — Progress Notes (Signed)
Physical Therapy Treatment Patient Details Name: Anita Moore MRN: [id number] DOB: 07-Aug-1941 Today's Date: 11/06/2012 Time: 4098-1191 PT Time Calculation (min): 25 min  PT Assessment / Plan / Recommendation Comments on Treatment Session  Pt moving well this afternoon, able to complete stairs. Limited ambulation distance secondary to fatigue. Pt with good quad strength, still with limited AAROM knee flexion. Plan to d/c home this afternoon    Follow Up Recommendations  Home health PT;Supervision/Assistance - 24 hour     Does the patient have the potential to tolerate intense rehabilitation     Barriers to Discharge        Equipment Recommendations  None recommended by PT    Recommendations for Other Services    Frequency 7X/week   Plan Frequency remains appropriate;Discharge plan remains appropriate    Precautions / Restrictions Precautions Precautions: None Restrictions Weight Bearing Restrictions: No   Pertinent Vitals/Pain Pain 4/10 with end range knee flexion. Pain meds given prior to session.     Mobility  Bed Mobility Bed Mobility: Not assessed Transfers Transfers: Sit to Stand;Stand to Sit Sit to Stand: 4: Min guard;With upper extremity assist;From chair/3-in-1 Stand to Sit: 4: Min guard;With upper extremity assist;To chair/3-in-1 Details for Transfer Assistance: [place] for safety. VC for hand placement to/from 3-1 to RW Ambulation/Gait Ambulation/Gait Assistance: 4: Min guard Ambulation Distance (Feet): 40 Feet Assistive device: Rolling walker Ambulation/Gait Assistance Details: VC for close distance to RW for increased support during ambulation. Cues for increased knee flexion as pt dragging RLE Gait Pattern: Step-to pattern;Decreased hip/knee flexion - right;Decreased stance time - right;Decreased step length - left;Trunk flexed Gait velocity: slow gait speed Stairs: Yes Stairs Assistance: 4: Min guard Stair Management Technique: No  rails;Backwards;With walker Number of Stairs: 1     Exercises Total Joint Exercises Heel Slides: AROM;Strengthening;Right;10 reps;Supine;Limitations;Seated Heel Slides Limitations: AROM supine, AAROM with towel seated. 0-65 degrees Straight Leg Raises: AROM;Strengthening;Right;10 reps;Supine Long Arc Quad: AROM;Strengthening;Right;10 reps;Seated   PT Diagnosis:    PT Problem List:   PT Treatment Interventions:     PT Goals Acute Rehab PT Goals PT Goal: Sit to Stand - Progress: Goal set today PT Goal: Stand to Sit - Progress: Goal set today PT Transfer Goal: Bed to Chair/Chair to Bed - Progress: Goal set today PT Goal: Ambulate - Progress: Goal set today PT Goal: Up/Down Stairs - Progress: Met  Visit Information  Last PT Received On: 11/06/12 Assistance Needed: +1    Subjective Data      Cognition  Overall Cognitive Status: Appears within functional limits for tasks assessed/performed Arousal/Alertness: Awake/alert Orientation Level: Appears intact for tasks assessed Behavior During Session: [facility] for tasks performed    Balance     End of Session PT - End of Session Equipment Utilized During Treatment: Gait belt Activity Tolerance: Patient tolerated treatment well Patient left: in chair;with call bell/phone within reach;with family/visitor present Nurse Communication: Mobility status   GP     Milana Kidney 11/06/2012, 2:09 PM

## 2012-11-07 NOTE — Progress Notes (Signed)
CARE MANAGEMENT NOTE 11/07/2012  Patient:  Anita Moore, Anita Moore   Account Number:  [id number]  Date Initiated:  11/07/2012  Documentation initiated by:  Colleen Can  Subjective/Objective Assessment:   dx total right knee replacemnt      HH arranged  HH-2 PT      [facility] agency  [facility]   Medicare Important Message given?  NA - LOS <3 / Initial given by admissions (Discharge Disposition:  HOME W HOME HEALTH SERVICES   Comments:  11/07/2012 Damaris Schooner RN CCM [phone] This CM not on duty day of discharge 11/06/2012. Per care co-ordinator pt was discharged with [facility] services in place yeasterday 01'11/2012. This was verified with [facility] care and services will start today.

## 2012-11-07 NOTE — Anesthesia Postprocedure Evaluation (Signed)
  Anesthesia Post-op Note  Patient: Anita Moore  Procedure(s) Performed: Procedure(s) (LRB): TOTAL KNEE ARTHROPLASTY (Right)  Patient Location: PACU  Anesthesia Type: Spinal  Level of Consciousness: awake and alert   Airway and Oxygen Therapy: Patient Spontanous Breathing  Post-op Pain: mild  Post-op Assessment: Post-op Vital signs reviewed, Patient's Cardiovascular Status Stable, Respiratory Function Stable, Patent Airway and No signs of Nausea or vomiting  Last Vitals:  Filed Vitals:   11/06/12 1000  BP: 111/71  Pulse: 70  Temp: 36.8 C  Resp: 16    Post-op Vital Signs: stable   Complications: No apparent anesthesia complications

## 2012-11-07 NOTE — Discharge Summary (Signed)
Physician Discharge Summary  Patient ID: Anita Moore MRN: [id number] DOB/AGE: 1941-06-01 72 y.o.  Admit date: 11/05/2012 Discharge date: 11/06/2012   Procedures:  Procedure(s) (LRB): TOTAL KNEE ARTHROPLASTY (Right)  Attending Physician:  Dr. Durene Romans   Admission Diagnoses:   Right knee OA / pain  Discharge Diagnoses:  Principal Problem:  *S/P right TKA Insomnia   Bilateral shoulder pain   Dysrhythmia   Arthritis   HPI:   Anita Moore, 72 y.o. female, has a history of pain and functional disability in the right knee due to arthritis and has failed non-surgical conservative treatments for greater than 12 weeks to includeNSAID's and/or analgesics, corticosteriod injections and activity modification. Onset of symptoms was gradual, starting 3 years ago with gradually worsening course since that time. The patient noted no past surgery on the right knee(s). Patient currently rates pain in the right knee(s) at 9 out of 10 with activity. Patient has night pain, worsening of pain with activity and weight bearing, pain that interferes with activities of daily living, pain with passive range of motion and crepitus. Patient has evidence of periarticular osteophytes and joint space narrowing by imaging studies. There is no active infection. Risks, benefits and expectations were discussed with the patient. Patient understand the risks, benefits and expectations and wishes to proceed with surgery.   PCP: Joycelyn Rua, MD   Discharged Condition: good  Hospital Course:  Patient underwent the above stated procedure on 11/05/2012. Patient tolerated the procedure well and brought to the recovery room in good condition and subsequently to the floor.  POD #1 BP: 115/68 ; Pulse: 71 ; Temp: 97.9 F (36.6 C) ; Resp: 16 Pt's foley was removed, as well as the hemovac drain removed. IV was changed to a saline lock. Patient reports pain as mild. Surprised at how well she is feeling  this am.  Feels like she wants to go home today. Neurovascular intact, dorsiflexion/plantar flexion intact, incision: dressing C/D/I, no cellulitis present and compartment soft.   LABS  Basename  11/06/12 0453   HGB  13.1  HCT  38.7    Discharge Exam: General appearance: alert, cooperative and no distress Extremities: Homans sign is negative, no sign of DVT, no edema, redness or tenderness in the calves or thighs and no ulcers, gangrene or trophic changes  Disposition:   Home or Self Care with follow up in 2 weeks   Follow-up Information    Follow up with [name], MD. Schedule an appointment as soon as possible for a visit in 2 weeks.   Contact information:   [address] [place] [address] [phone]          Discharge Orders    Future Orders Please Complete By Expires   Diet - low sodium heart healthy      Call MD / Call 911      Comments:   If you experience chest pain or shortness of breath, CALL 911 and be transported to the hospital emergency room.  If you develope a fever above 101 F, pus (white drainage) or increased drainage or redness at the wound, or calf pain, call your surgeon's office.   Discharge instructions      Comments:   Maintain surgical dressing for 10-14 days, then replace with gauze and tape. Keep the area dry and clean until follow up. Follow up in 2 weeks at [facility]. Call with any questions or concerns.   Constipation Prevention  Comments:   Drink plenty of fluids.  Prune juice may be helpful.  You may use a stool softener, such as Colace (over the counter) 100 mg twice a day.  Use MiraLax (over the counter) for constipation as needed.   Increase activity slowly as tolerated      TED hose      Comments:   Use stockings (TED hose) for 2 weeks on both leg(s).  You may remove them at night for sleeping.   Change dressing      Comments:   Maintain surgical dressing for 10-14 days, then change the dressing  daily with sterile 4 x 4 inch gauze dressing and tape. Keep the area dry and clean.   Driving restrictions      Comments:   No driving for 4 weeks   Weight bearing as tolerated         Discharge Medication List as of 11/06/2012 10:05 AM    START taking these medications   Details  aspirin EC 325 MG tablet Take 1 tablet (325 mg total) by mouth 2 (two) times daily. X 4 weeks, Starting 11/06/2012, Until Discontinued, No Print    diphenhydrAMINE (BENADRYL) 25 mg capsule Take 1 capsule (25 mg total) by mouth every 6 (six) hours as needed for itching, allergies or sleep., Starting 11/06/2012, Until Discontinued, No Print    docusate sodium 100 MG CAPS Take 100 mg by mouth 2 (two) times daily., Starting 11/06/2012, Until Discontinued, No Print    ferrous sulfate 325 (65 FE) MG tablet Take 1 tablet (325 mg total) by mouth 3 (three) times daily after meals., Starting 11/06/2012, Until Discontinued, No Print    HYDROcodone-acetaminophen (NORCO) 7.5-325 MG per tablet Take 1-2 tablets by mouth every 4 (four) hours as needed for pain., Starting 11/06/2012, Until Discontinued, Print    methocarbamol (ROBAXIN) 500 MG tablet Take 1 tablet (500 mg total) by mouth every 6 (six) hours as needed (muscle spasms)., Starting 11/06/2012, Until Discontinued, No Print    polyethylene glycol (MIRALAX / GLYCOLAX) packet Take 17 g by mouth 2 (two) times daily., Starting 11/06/2012, Until Discontinued, No Print      CONTINUE these medications which have NOT CHANGED   Details  metoprolol succinate (TOPROL-XL) 25 MG 24 hr tablet Take 25 mg by mouth daily., Until Discontinued, Historical Med         Signed: Anastasio Auerbach. [name]   PAC  11/07/2012, 8:48 AM

## 2012-11-08 ENCOUNTER — Encounter (HOSPITAL_COMMUNITY): Payer: Self-pay | Admitting: Orthopedic Surgery

## 2012-11-08 ENCOUNTER — Inpatient Hospital Stay (HOSPITAL_COMMUNITY): Admission: RE | Admit: 2012-11-08 | Payer: PRIVATE HEALTH INSURANCE | Source: Ambulatory Visit

## 2012-11-18 ENCOUNTER — Inpatient Hospital Stay: Admit: 2012-11-18 | Payer: Self-pay | Admitting: Orthopedic Surgery

## 2012-11-18 SURGERY — ARTHROPLASTY, KNEE, TOTAL
Anesthesia: Spinal | Laterality: Right

## 2013-03-29 IMAGING — CR DG CHEST 2V
2 series · 2 of 2 positions shown · non-contrast
Comparison: Chest x-ray 05/02/2010.

CLINICAL DATA: Preoperative evaluation prior to right total knee
arthroplasty.

CHEST - 2 VIEW

[w chest pa]
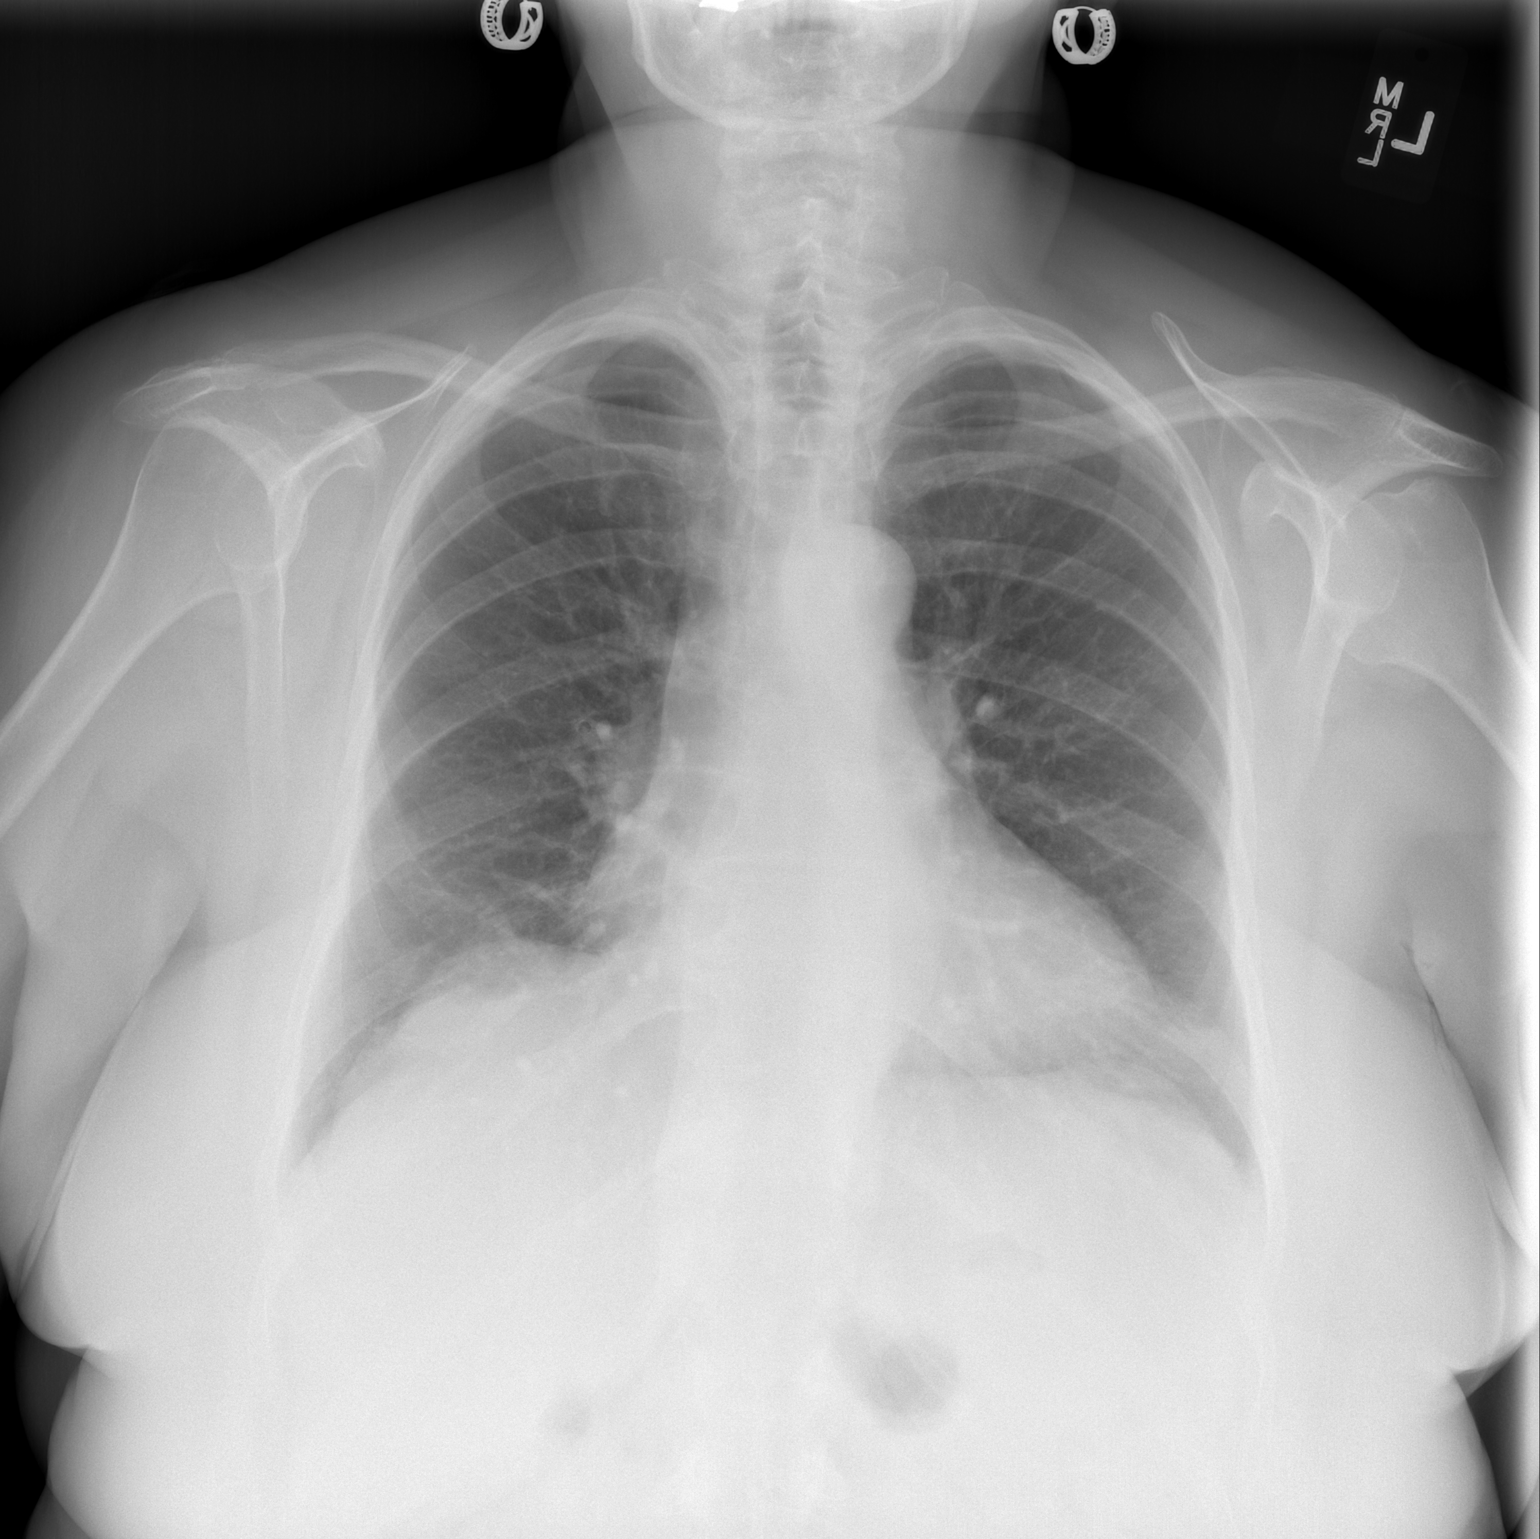

[w chest lat]
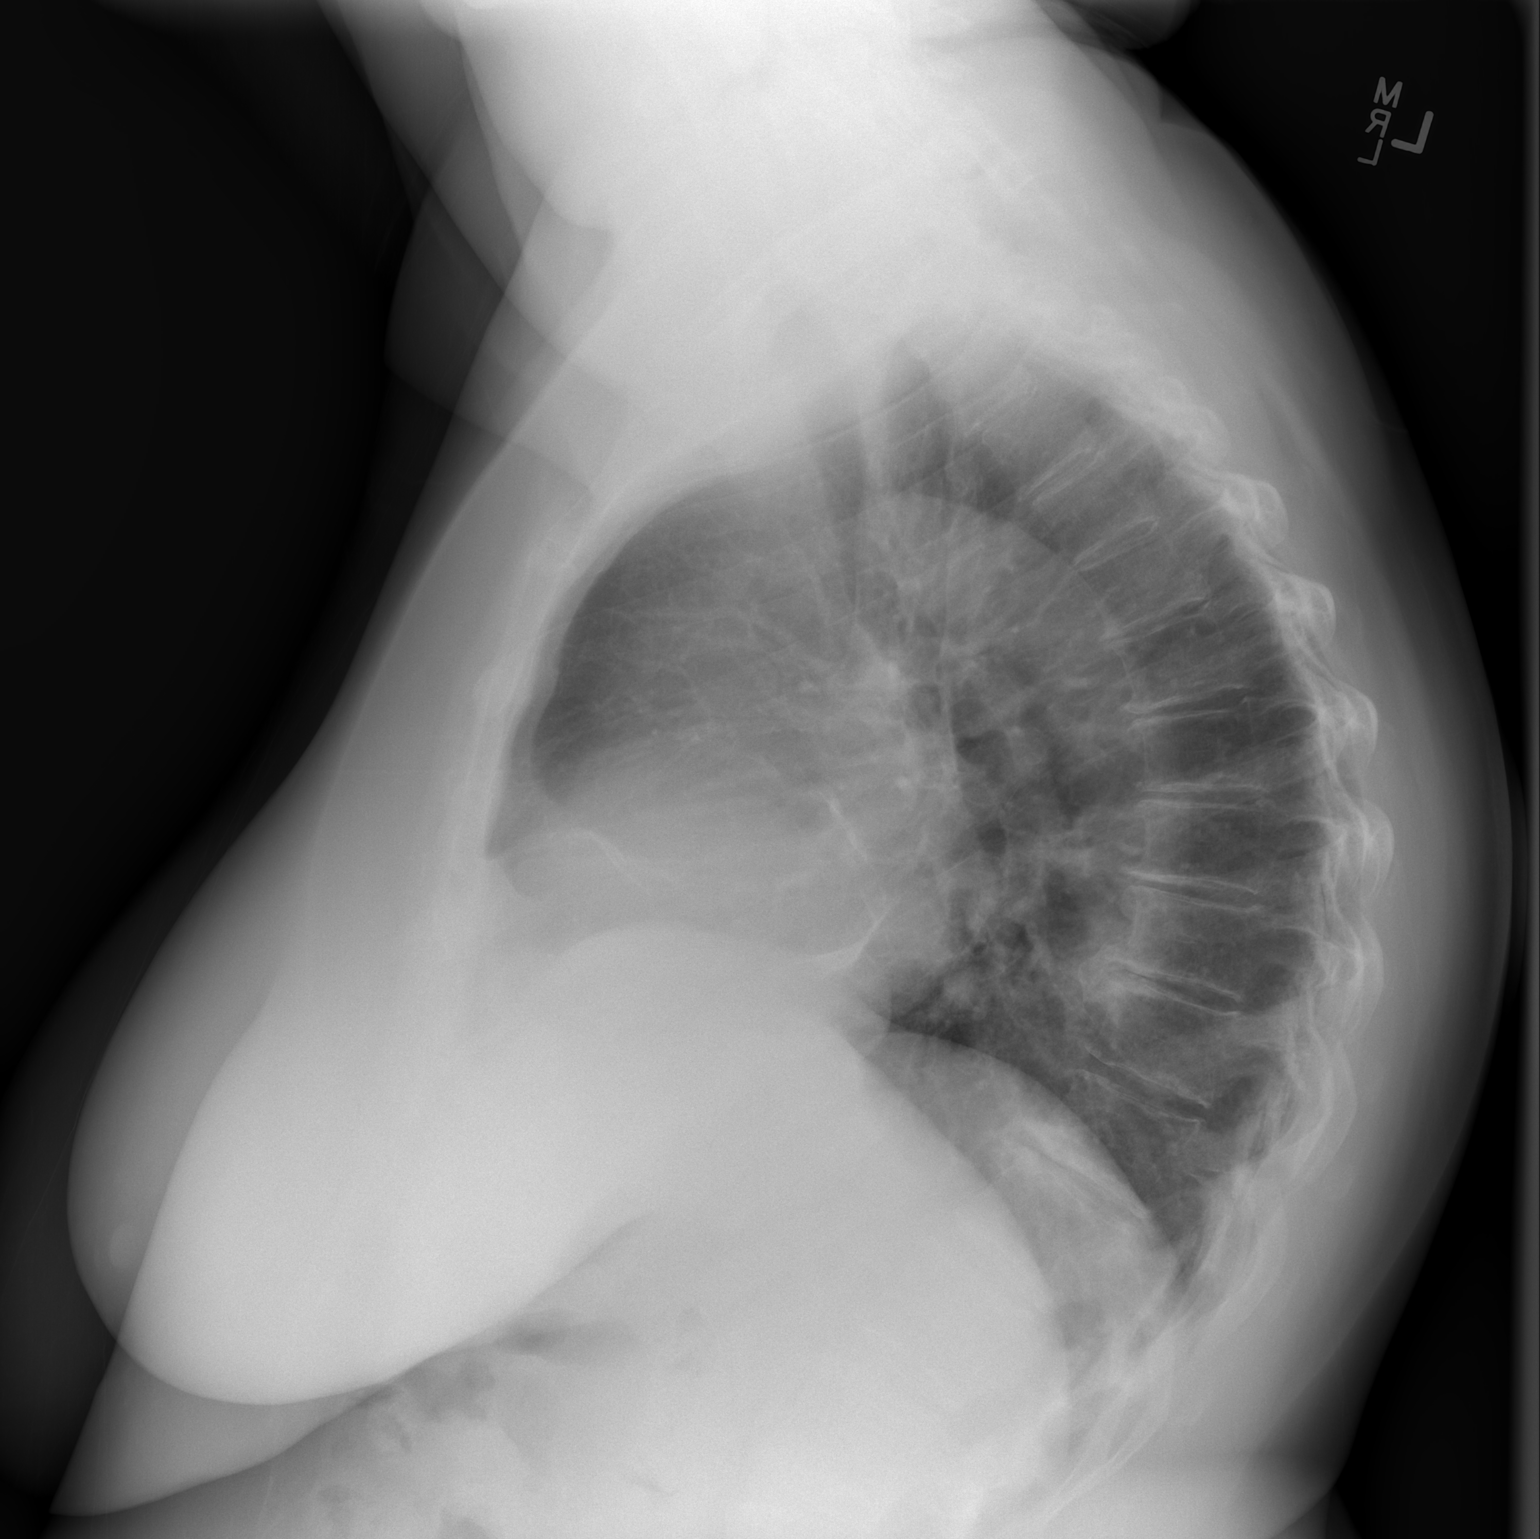

[2 of 2 positions shown; findings below may reference images not displayed]

FINDINGS: Lung volumes are normal.  No consolidative airspace
disease.  No pleural effusions.  No pneumothorax. There are linear
opacities in the region of the right middle lobe, most compatible
with subsegmental atelectasis and/or scarring.  No pulmonary nodule
or mass noted.  Pulmonary vasculature and the cardiomediastinal
silhouette are within normal limits.
IMPRESSION: 1. No radiographic evidence of acute cardiopulmonary disease.

## 2014-02-25 ENCOUNTER — Ambulatory Visit: Payer: Medicare Other | Admitting: Interventional Cardiology

## 2014-03-09 ENCOUNTER — Encounter: Payer: Self-pay | Admitting: Interventional Cardiology

## 2014-04-17 ENCOUNTER — Ambulatory Visit: Payer: PRIVATE HEALTH INSURANCE | Admitting: Interventional Cardiology

## 2014-05-07 ENCOUNTER — Encounter: Payer: Self-pay | Admitting: Cardiology

## 2014-05-07 DIAGNOSIS — I471 Supraventricular tachycardia, unspecified: Secondary | ICD-10-CM

## 2014-05-07 HISTORY — DX: Supraventricular tachycardia: I47.1

## 2014-05-07 HISTORY — DX: Supraventricular tachycardia, unspecified: I47.10

## 2014-05-14 ENCOUNTER — Ambulatory Visit: Payer: Medicare Other | Admitting: Interventional Cardiology

## 2014-06-19 ENCOUNTER — Other Ambulatory Visit: Payer: Self-pay | Admitting: Family Medicine

## 2014-06-19 DIAGNOSIS — Z1231 Encounter for screening mammogram for malignant neoplasm of breast: Secondary | ICD-10-CM

## 2014-06-30 ENCOUNTER — Ambulatory Visit
Admission: RE | Admit: 2014-06-30 | Discharge: 2014-06-30 | Disposition: A | Discharge status: Home / Self Care | Payer: Medicare Other | Source: Ambulatory Visit | Attending: Family Medicine | Admitting: Family Medicine

## 2014-06-30 DIAGNOSIS — Z1231 Encounter for screening mammogram for malignant neoplasm of breast: Secondary | ICD-10-CM

## 2014-07-02 ENCOUNTER — Other Ambulatory Visit: Payer: Self-pay | Admitting: Family Medicine

## 2014-07-02 DIAGNOSIS — R928 Other abnormal and inconclusive findings on diagnostic imaging of breast: Secondary | ICD-10-CM

## 2014-07-08 ENCOUNTER — Other Ambulatory Visit: Payer: Self-pay | Admitting: Family Medicine

## 2014-07-08 ENCOUNTER — Ambulatory Visit
Admission: RE | Admit: 2014-07-08 | Discharge: 2014-07-08 | Disposition: A | Discharge status: Home / Self Care | Payer: Medicare Other | Source: Ambulatory Visit | Attending: Family Medicine | Admitting: Family Medicine

## 2014-07-08 DIAGNOSIS — R928 Other abnormal and inconclusive findings on diagnostic imaging of breast: Secondary | ICD-10-CM

## 2014-07-09 ENCOUNTER — Ambulatory Visit: Payer: Medicare Other | Admitting: Interventional Cardiology

## 2014-07-16 ENCOUNTER — Ambulatory Visit
Admission: RE | Admit: 2014-07-16 | Discharge: 2014-07-16 | Disposition: A | Discharge status: Home / Self Care | Payer: Medicare Other | Source: Ambulatory Visit | Attending: Family Medicine | Admitting: Family Medicine

## 2014-07-16 DIAGNOSIS — R928 Other abnormal and inconclusive findings on diagnostic imaging of breast: Secondary | ICD-10-CM

## 2014-11-04 ENCOUNTER — Encounter: Payer: Self-pay | Admitting: Interventional Cardiology

## 2014-12-15 IMAGING — MG MM LT BREAST BX W LOC DEV 1ST LESION IMAGE BX SPEC STEREO GUIDE
1 series · 1 of 1 positions shown · non-contrast
Comparison: Previous exams.

ADDENDUM:
Pathology results: Pathology results from the stereotactic guided
biopsy of calcifications in the left breast revealed
fibroadenomatoid changes. This is concordant with the imaging
findings. The patient has been notified of the results. She is doing
well and denies any biopsy site complications.

Return to routine screening mammography is recommended, for which
the patient will be due June 2015. The patient has been instructed
to call the [REDACTED] with any questions or concerns.
CLINICAL DATA: 73-year-old female with suspicious left breast
calcifications.
EXAM:
RIGHT BREAST STEREOTACTIC CORE NEEDLE BIOPSY

[L SPECIMEN]
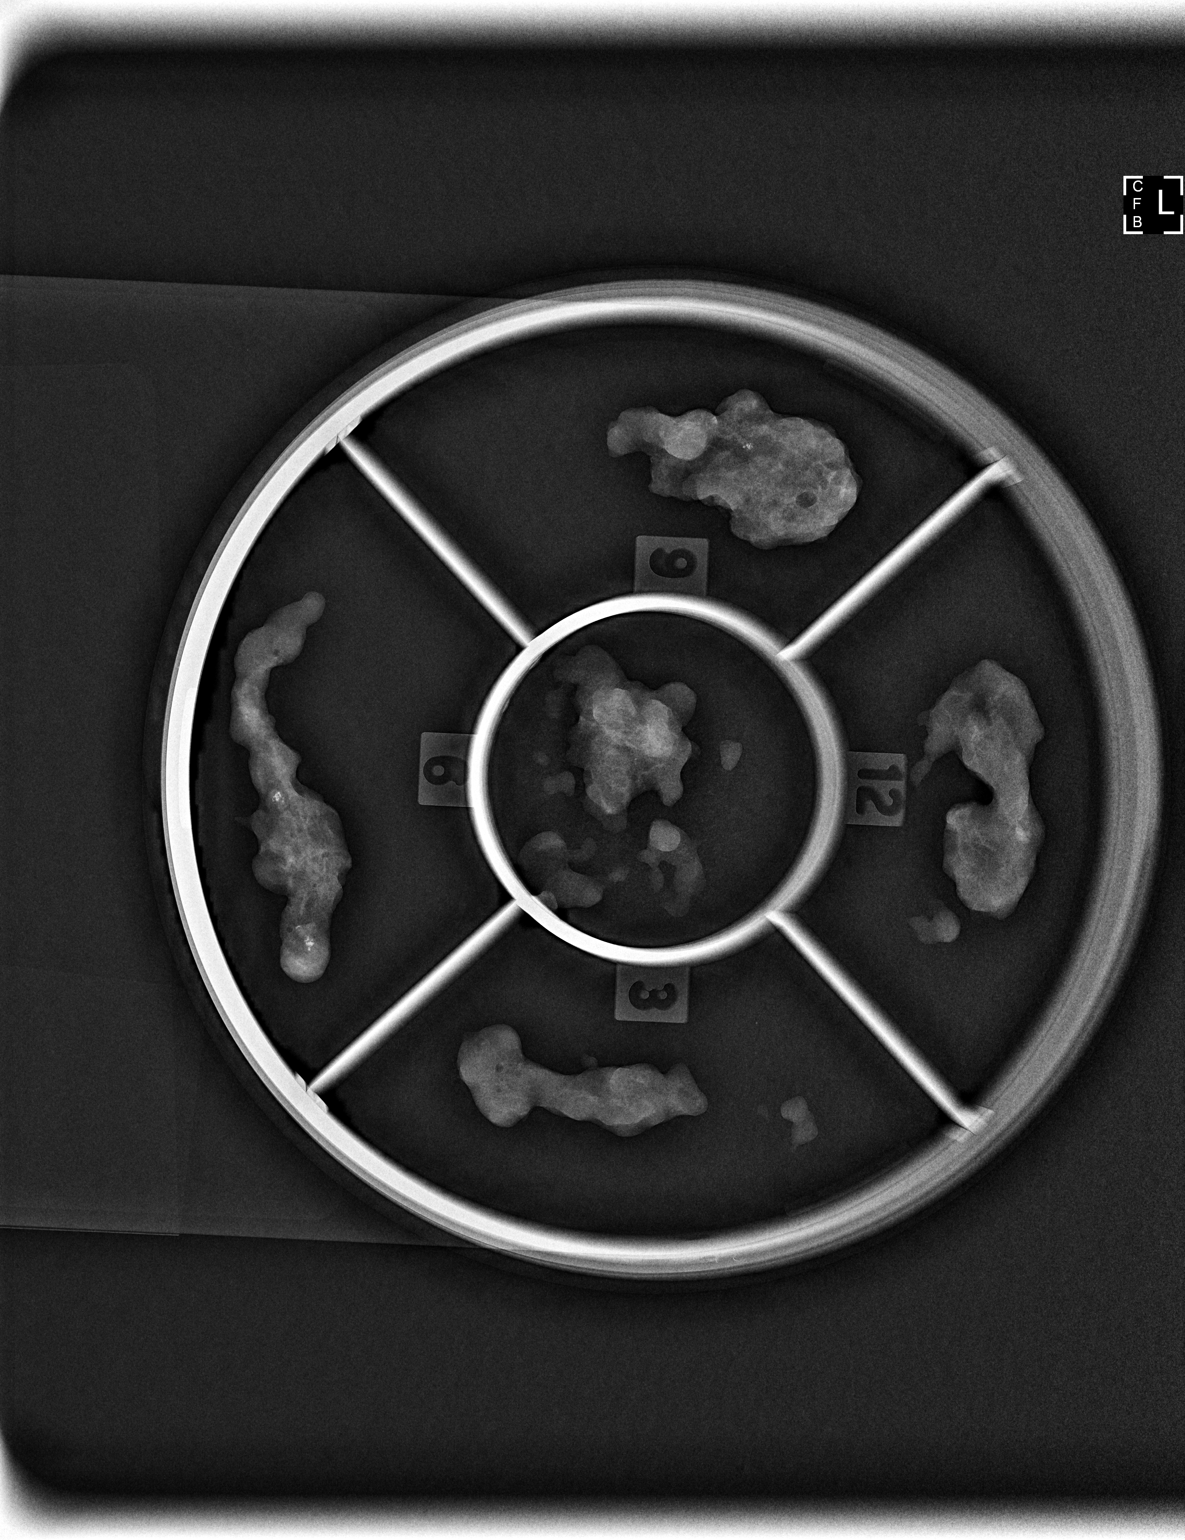

[1 of 1 positions shown; findings below may reference images not displayed]



Using sterile technique and 2% Lidocaine as local anesthetic, under
stereotactic guidance, a 9 gauge vacuum device was used to perform
core needle biopsy of the suspicious calcifications in the lower
left breast at 6 o'clock using an inferior approach. Specimen
radiograph was performed showing the presence of calcifications in
at least 2 of the specimens. Specimens with calcifications are
identified for pathology.

At the conclusion of the procedure, an X tissue marker clip was
deployed into the biopsy cavity. Follow-up 2-view mammogram
confirmed clip appropriately positioned.
IMPRESSION: Stereotactic-guided biopsy of the suspicious calcifications in the
left breast at 6 o'clock. No apparent complications.

## 2015-12-11 DIAGNOSIS — J018 Other acute sinusitis: Secondary | ICD-10-CM | POA: Diagnosis not present

## 2015-12-29 DIAGNOSIS — H11111 Conjunctival deposits, right eye: Secondary | ICD-10-CM | POA: Diagnosis not present

## 2016-01-03 DIAGNOSIS — J309 Allergic rhinitis, unspecified: Secondary | ICD-10-CM | POA: Diagnosis not present

## 2016-01-03 DIAGNOSIS — J0100 Acute maxillary sinusitis, unspecified: Secondary | ICD-10-CM | POA: Diagnosis not present

## 2016-04-21 DIAGNOSIS — M25562 Pain in left knee: Secondary | ICD-10-CM | POA: Diagnosis not present

## 2016-04-21 DIAGNOSIS — M2392 Unspecified internal derangement of left knee: Secondary | ICD-10-CM | POA: Diagnosis not present

## 2016-04-21 DIAGNOSIS — M1712 Unilateral primary osteoarthritis, left knee: Secondary | ICD-10-CM | POA: Diagnosis not present

## 2016-05-19 DIAGNOSIS — T148 Other injury of unspecified body region: Secondary | ICD-10-CM | POA: Diagnosis not present

## 2016-09-07 DIAGNOSIS — J019 Acute sinusitis, unspecified: Secondary | ICD-10-CM | POA: Diagnosis not present

## 2016-11-14 DIAGNOSIS — Z13220 Encounter for screening for lipoid disorders: Secondary | ICD-10-CM | POA: Diagnosis not present

## 2016-11-14 DIAGNOSIS — I471 Supraventricular tachycardia: Secondary | ICD-10-CM | POA: Diagnosis not present

## 2016-11-14 DIAGNOSIS — Z131 Encounter for screening for diabetes mellitus: Secondary | ICD-10-CM | POA: Diagnosis not present

## 2016-11-15 ENCOUNTER — Telehealth: Payer: Self-pay

## 2016-11-15 NOTE — Telephone Encounter (Signed)
SENT NOTES TO SCHEDULING 

## 2016-11-22 ENCOUNTER — Ambulatory Visit: Payer: Medicare Other | Admitting: Interventional Cardiology

## 2016-11-23 ENCOUNTER — Ambulatory Visit: Payer: Medicare Other | Admitting: Interventional Cardiology

## 2016-12-01 ENCOUNTER — Encounter: Payer: Self-pay | Admitting: *Deleted

## 2016-12-04 ENCOUNTER — Encounter: Payer: Self-pay | Admitting: Interventional Cardiology

## 2016-12-04 ENCOUNTER — Ambulatory Visit (INDEPENDENT_AMBULATORY_CARE_PROVIDER_SITE_OTHER): Payer: Medicare Other | Admitting: Interventional Cardiology

## 2016-12-04 VITALS — BP 128/86 | HR 78 | Ht 64.0 in | Wt 245.8 lb

## 2016-12-04 DIAGNOSIS — Z8249 Family history of ischemic heart disease and other diseases of the circulatory system: Secondary | ICD-10-CM

## 2016-12-04 DIAGNOSIS — I059 Rheumatic mitral valve disease, unspecified: Secondary | ICD-10-CM | POA: Diagnosis not present

## 2016-12-04 DIAGNOSIS — E669 Obesity, unspecified: Secondary | ICD-10-CM

## 2016-12-04 DIAGNOSIS — IMO0001 Reserved for inherently not codable concepts without codable children: Secondary | ICD-10-CM

## 2016-12-04 DIAGNOSIS — Z6841 Body Mass Index (BMI) 40.0 and over, adult: Secondary | ICD-10-CM

## 2016-12-04 DIAGNOSIS — I471 Supraventricular tachycardia, unspecified: Secondary | ICD-10-CM

## 2016-12-04 NOTE — Progress Notes (Signed)
Cardiology Office Note   Date:  12/04/2016   ID:  Anita HumblesCatherine A Coletta, DOB 05-29-1941, MRN [id number]  PCP:  Joycelyn RuaMEYERS, STEPHEN, MD    No chief complaint on file.  SVT  Wt Readings from Last 3 Encounters:  12/04/16 245 lb 12.8 oz (111.5 kg)  11/05/12 237 lb (107.5 kg)  10/28/12 240 lb (108.9 kg)       History of Present Illness: Anita Moore is a 76 y.o. female  Who has a h/o SVT controlled on medications.  In July, she was [place] to a [employer] [place]  THat night, she had an epsiode of SVT.  Since then, she has noticed that whenever she goes there, she has stress and the SVT will come back.  She has relief by taking her prn metoprolol.  She relaxed and drank water.    Since that time, she has changed her attitude towards the [employer]. She realizes their things out of her control. This has helped her stay more relaxed. She has not had anymore SVT in the past few months.  Her brother had an MI several months ago. He was treated with a stent and he is doing better. This also made her want to come be seen. The patient denies any chest discomfort. She has not been exercising. She is limited somewhat by knee pain from arthritis. She may need a left total knee replacement in the future. She already had a right total knee replacement several years ago. She is going to try to be more active and buildup her stamina. She has noticed some shortness of breath but feels this is from lack of exercise.    Past Medical History:  Diagnosis Date  . Arthritis 1990   osteoarthris knees  . Bilateral shoulder pain   . Dizziness and giddiness   . DJD (degenerative joint disease)    RIGHT KNEE  . Dysrhythmia 2010   SVT  . Insomnia   . Obesity   . Paroxysmal supraventricular tachycardia (HCC) 05/07/2014    Past Surgical History:  Procedure Laterality Date  . CARDIAC CATHETERIZATION    . CHOLECYSTECTOMY  1985  . EXCISIONAL HEMORRHOIDECTOMY  1970  .  TONSILLECTOMY     as child  . TOTAL KNEE ARTHROPLASTY  11/05/2012   Procedure: TOTAL KNEE ARTHROPLASTY;  Surgeon: Shelda PalMatthew D Olin, MD;  Location: [facility];  Service: Orthopedics;  Laterality: Right;  right      Current Outpatient Prescriptions  Medication Sig Dispense Refill  . docusate sodium 100 MG CAPS Take 100 mg by mouth 2 (two) times daily. 10 capsule   . metoprolol succinate (TOPROL-XL) 25 MG 24 hr tablet Take 25 mg by mouth as needed (HEART RATE).      No current facility-administered medications for this visit.     Allergies:   Codeine and Other    Social History:  The patient  reports that she has never smoked. She has never used smokeless tobacco. She reports that she drinks alcohol. She reports that she does not use drugs.   Family History:  The patient's family history includes Heart attack in her mother.    ROS:  Please see the history of present illness.   Otherwise, review of systems are positive for some DOE.   All other systems are reviewed and negative.    PHYSICAL EXAM: VS:  BP 128/86   Pulse 78   Ht 5\' 4"  (1.626 m)   Wt 245  lb 12.8 oz (111.5 kg)   BMI 42.19 kg/m  , BMI Body mass index is 42.19 kg/m. GEN: Well nourished, well developed, in no acute distress  HEENT: normal  Neck: no JVD, carotid bruits, or masses Cardiac: RRR; no murmurs, rubs, or gallops,no edema  Respiratory:  clear to auscultation bilaterally, normal work of breathing GI: soft, nontender, nondistended, + BS, obese MS: no deformity or atrophy  Skin: warm and dry, no rash Neuro:  Strength and sensation are intact Psych: euthymic mood, full affect   EKG:   The ekg ordered today demonstrates normal sinus rhythm, no significant ST segment changes   Recent Labs: No results found for requested labs within last 8760 hours.   Lipid Panel    Component Value Date/Time   CHOL 175 05/21/2009 1040   TRIG 162.0 (H) 05/21/2009 1040   HDL 45.50 05/21/2009 1040   CHOLHDL 4 05/21/2009 1040     VLDL 32.4 05/21/2009 1040   LDLCALC 97 05/21/2009 1040   LDLDIRECT 140.9 01/28/2007 1023     Other studies Reviewed: Additional studies/ records that were reviewed today with results demonstrating: Normal EF in 2011.   ASSESSMENT AND PLAN:  1. SVT: Continue using metoprolol as needed. Managing stress has helped her symptoms as well.  She did have more frequent SVT when under high stress as noted above. No symptoms in the last couple of months. 2. Family h/o CAD: Brother with MI in the past 6 months. We spoke about risk factor modification including increasing exercise and trying to lose weight. 3. SHOB: Increase exercise gradually.  Some of her shortness of breath is likely from deconditioning. She has not been very active. She does need to find exercises that will not bother her knee problems. 4. Obesity: Exercise will help with weight loss.  She also needs to try to eat healthy. Minimizing carbohydrates should also help with weight loss.   Current medicines are reviewed at length with the patient today.  The patient concerns regarding her medicines were addressed.  The following changes have been made:  No change  Labs/ tests ordered today include: Orders Placed This Encounter  Procedures  . EKG 12-Lead    Recommend 150 minutes/week of aerobic exercise Low fat, low carb, high fiber diet recommended  Disposition:   FU in 1 year   Signed, Lance Muss, MD  12/04/2016 10:26 AM    [facility] [address] [place], [place], [place]  [address] Phone: [phone]; Fax: [phone]

## 2016-12-04 NOTE — Patient Instructions (Signed)

## 2016-12-05 DIAGNOSIS — E669 Obesity, unspecified: Secondary | ICD-10-CM | POA: Insufficient documentation

## 2016-12-05 DIAGNOSIS — Z8249 Family history of ischemic heart disease and other diseases of the circulatory system: Secondary | ICD-10-CM | POA: Insufficient documentation

## 2016-12-06 DIAGNOSIS — Z131 Encounter for screening for diabetes mellitus: Secondary | ICD-10-CM | POA: Diagnosis not present

## 2016-12-06 DIAGNOSIS — Z136 Encounter for screening for cardiovascular disorders: Secondary | ICD-10-CM | POA: Diagnosis not present

## 2016-12-06 DIAGNOSIS — Z13220 Encounter for screening for lipoid disorders: Secondary | ICD-10-CM | POA: Diagnosis not present

## 2016-12-06 DIAGNOSIS — I471 Supraventricular tachycardia: Secondary | ICD-10-CM | POA: Diagnosis not present

## 2017-08-01 DIAGNOSIS — Z1239 Encounter for other screening for malignant neoplasm of breast: Secondary | ICD-10-CM | POA: Diagnosis not present

## 2017-08-01 DIAGNOSIS — M25369 Other instability, unspecified knee: Secondary | ICD-10-CM | POA: Diagnosis not present

## 2017-08-01 DIAGNOSIS — I471 Supraventricular tachycardia: Secondary | ICD-10-CM | POA: Diagnosis not present

## 2017-08-01 DIAGNOSIS — Z6841 Body Mass Index (BMI) 40.0 and over, adult: Secondary | ICD-10-CM | POA: Diagnosis not present

## 2017-08-01 DIAGNOSIS — M1990 Unspecified osteoarthritis, unspecified site: Secondary | ICD-10-CM | POA: Diagnosis not present

## 2017-08-01 DIAGNOSIS — Z1211 Encounter for screening for malignant neoplasm of colon: Secondary | ICD-10-CM | POA: Diagnosis not present

## 2017-08-01 DIAGNOSIS — R7303 Prediabetes: Secondary | ICD-10-CM | POA: Diagnosis not present

## 2017-08-01 DIAGNOSIS — Z0000 Encounter for general adult medical examination without abnormal findings: Secondary | ICD-10-CM | POA: Diagnosis not present

## 2017-08-01 DIAGNOSIS — I422 Other hypertrophic cardiomyopathy: Secondary | ICD-10-CM | POA: Diagnosis not present

## 2017-08-01 DIAGNOSIS — E785 Hyperlipidemia, unspecified: Secondary | ICD-10-CM | POA: Diagnosis not present

## 2017-08-02 ENCOUNTER — Other Ambulatory Visit: Payer: Self-pay | Admitting: Family Medicine

## 2017-08-02 DIAGNOSIS — E2839 Other primary ovarian failure: Secondary | ICD-10-CM

## 2017-08-17 DIAGNOSIS — H6691 Otitis media, unspecified, right ear: Secondary | ICD-10-CM | POA: Diagnosis not present

## 2018-03-27 DIAGNOSIS — Z1211 Encounter for screening for malignant neoplasm of colon: Secondary | ICD-10-CM | POA: Diagnosis not present

## 2018-12-03 DIAGNOSIS — H5713 Ocular pain, bilateral: Secondary | ICD-10-CM | POA: Diagnosis not present

## 2018-12-03 DIAGNOSIS — H25813 Combined forms of age-related cataract, bilateral: Secondary | ICD-10-CM | POA: Diagnosis not present

## 2018-12-09 DIAGNOSIS — J0140 Acute pansinusitis, unspecified: Secondary | ICD-10-CM | POA: Diagnosis not present

## 2019-01-03 DIAGNOSIS — K30 Functional dyspepsia: Secondary | ICD-10-CM | POA: Diagnosis not present

## 2019-01-03 DIAGNOSIS — I471 Supraventricular tachycardia: Secondary | ICD-10-CM | POA: Diagnosis not present

## 2019-01-03 DIAGNOSIS — J019 Acute sinusitis, unspecified: Secondary | ICD-10-CM | POA: Diagnosis not present

## 2019-01-14 DIAGNOSIS — H04123 Dry eye syndrome of bilateral lacrimal glands: Secondary | ICD-10-CM | POA: Diagnosis not present

## 2019-01-14 DIAGNOSIS — H524 Presbyopia: Secondary | ICD-10-CM | POA: Diagnosis not present

## 2019-01-14 DIAGNOSIS — H52223 Regular astigmatism, bilateral: Secondary | ICD-10-CM | POA: Diagnosis not present

## 2019-01-14 DIAGNOSIS — H5713 Ocular pain, bilateral: Secondary | ICD-10-CM | POA: Diagnosis not present

## 2019-01-14 DIAGNOSIS — H25813 Combined forms of age-related cataract, bilateral: Secondary | ICD-10-CM | POA: Diagnosis not present

## 2019-04-10 ENCOUNTER — Telehealth: Payer: Self-pay | Admitting: Interventional Cardiology

## 2019-04-10 NOTE — Telephone Encounter (Signed)
New Message   Patient is calling because she has SVT and she has been managing them on her own. Last seen 2018. But now its more frequent and she is sob. You can hear it on the phone.   Pt c/o Shortness Of Breath: STAT if SOB developed within the last 24 hours or pt is noticeably SOB on the phone  1. Are you currently SOB (can you hear that pt is SOB on the phone)? Yes, can hear it on the phone.   2. How long have you been experiencing SOB? All day  3. Are you SOB when sitting or when up moving around? both  4. Are you currently experiencing any other symptoms? SVT

## 2019-04-10 NOTE — Telephone Encounter (Signed)
Received call directly from operator due to patient's SOB Patient states SOB began a few moments ago with the onset of SVT. She states she is starting to feel better because HR is coming down. I encouraged her to take her time in answering my questions and she was able to begin to speak without difficulty and state that she was starting to feel better. States she has always taken Toprol XL 25 or 50 mg PRN for SVT but episodes are occurring more frequently and she wants to see Dr. Eldridge Dace in the office. States she had another episode a few days ago in [place] because of wearing the mask. Reports HR ranges from 80 bpm (her normal) to 150 bpm today during this episode and the episode a few days ago. I advised her to take her Toprol 25 mg daily until her appointment. I scheduled her to see Dr. Eldridge Dace in the office on 6/8 and advised that his nurse [place] may call her to change the time. She states she is so thankful for the help and feels much better now.

## 2019-04-10 NOTE — Telephone Encounter (Signed)
    COVID-19 Pre-Screening Questions:  . In the past 7 to 10 days have you had a cough,  shortness of breath, headache, congestion, fever (100 or greater) body aches, chills, sore throat, or sudden loss of taste or sense of smell? No . Have you been around anyone with known Covid 19. No . Have you been around anyone who is awaiting Covid 19 test results in the past 7 to 10 days? No . Have you been around anyone who has been exposed to Covid 19, or has mentioned symptoms of Covid 19 within the past 7 to 10 days? No  If you have any concerns/questions about symptoms patients report during screening (either on the phone or at threshold). Contact the provider seeing the patient or DOD for further guidance.  If neither are available contact a member of the leadership team.   Patient is aware to call the office to reschedule if she develops any of these symptoms prior to Monday. She will bring her own mask and is aware she will be screened in the lobby upon arrival.

## 2019-04-11 NOTE — Telephone Encounter (Signed)
Let's do a 14 Zio patch and possible refer to EP.

## 2019-04-11 NOTE — Telephone Encounter (Signed)
Attempted to contact patient but there was no answer and VM did not pick up. Patient has appointment on 6/8.

## 2019-04-14 ENCOUNTER — Ambulatory Visit (INDEPENDENT_AMBULATORY_CARE_PROVIDER_SITE_OTHER): Payer: Medicare Other | Admitting: Interventional Cardiology

## 2019-04-14 ENCOUNTER — Telehealth: Payer: Self-pay

## 2019-04-14 ENCOUNTER — Encounter: Payer: Self-pay | Admitting: Interventional Cardiology

## 2019-04-14 ENCOUNTER — Other Ambulatory Visit: Payer: Self-pay

## 2019-04-14 VITALS — BP 142/90 | HR 61 | Ht 64.0 in | Wt 240.8 lb

## 2019-04-14 DIAGNOSIS — E782 Mixed hyperlipidemia: Secondary | ICD-10-CM | POA: Diagnosis not present

## 2019-04-14 DIAGNOSIS — I471 Supraventricular tachycardia: Secondary | ICD-10-CM | POA: Diagnosis not present

## 2019-04-14 DIAGNOSIS — Z8249 Family history of ischemic heart disease and other diseases of the circulatory system: Secondary | ICD-10-CM

## 2019-04-14 MED ORDER — METOPROLOL SUCCINATE ER 25 MG PO TB24: 25.0000 mg | ORAL_TABLET | ORAL | 1 refills | Status: DC | PRN

## 2019-04-14 NOTE — Patient Instructions (Signed)
Medication Instructions:  Your physician recommends that you continue on your current medications as directed. Please refer to the Current Medication list given to you today.  If you need a refill on your cardiac medications before your next appointment, please call your pharmacy.   Lab work: Your physician recommends that you return for a FASTING lipid profile and complete metabolic panel   If you have labs (blood work) drawn today and your tests are completely normal, you will receive your results only by: . MyChart Message (if you have MyChart) OR . A paper copy in the mail If you have any lab test that is abnormal or we need to change your treatment, we will call you to review the results.  Testing/Procedures: None ordered  Follow-Up: At [facility], you and your health needs are our priority.  As part of our continuing mission to provide you with exceptional heart care, we have created designated Provider Care Teams.  These Care Teams include your primary Cardiologist (physician) and Advanced Practice Providers (APPs -  Physician Assistants and Nurse Practitioners) who all work together to provide you with the care you need, when you need it. . You will need a follow up appointment in 1 year.  Please call our office 2 months in advance to schedule this appointment.  You may see Jay Varanasi, MD or one of the following Advanced Practice Providers on your designated Care Team:   . Brittainy Simmons, PA-C . Dayna Dunn, PA-C . Michele Lenze, PA-C  Any Other Special Instructions Will Be Listed Below (If Applicable).    

## 2019-04-14 NOTE — Progress Notes (Signed)
Cardiology Office Note   Date:  04/14/2019   ID:  Anita Moore, DOB 02/16/1941, MRN [id number]  PCP:  Joycelyn RuaMeyers, Stephen, MD    No chief complaint on file.  SVT  Wt Readings from Last 3 Encounters:  04/14/19 240 lb 12.8 oz (109.2 kg)  12/04/16 245 lb 12.8 oz (111.5 kg)  11/05/12 237 lb (107.5 kg)       History of Present Illness: Anita HumblesCatherine A Bellin is a 78 y.o. female  Who has a h/o SVT controlled on medications.    In July, she was elected to a [employer] [place]  THat night, she had an epsiode of SVT.  Since then, she has noticed that whenever she goes there, she has stress and the SVT will come back.  She has relief by taking her prn metoprolol.  She relaxed and drank water.    Since that time, she has changed her attitude towards the [employer]. She realizes their things out of her control. This has helped her stay more relaxed.  SVT decreased.  THis has still been a source of stress.  Her brother had an MI in 2017. He was treated with a stent and he is doing better.  He continues to do well.   In more recent weeks in 2020, she has had more SVT, up to 158 bpm.  Resolved after taking metoprolol prn dose within a few hours along with rest; typically resolves 45. One episode occurred with wearing a mask and feeling that she did not get air.  She had another stressful.  Denies : Chest pain. Dizziness. Leg edema. Nitroglycerin use. Orthopnea.  Paroxysmal nocturnal dyspnea. Shortness of breath. Syncope.     Past Medical History:  Diagnosis Date  . Arthritis 1990   osteoarthris knees  . Bilateral shoulder pain   . Dizziness and giddiness   . DJD (degenerative joint disease)    RIGHT KNEE  . Dysrhythmia 2010   SVT  . Insomnia   . Obesity   . Paroxysmal supraventricular tachycardia (HCC) 05/07/2014    Past Surgical History:  Procedure Laterality Date  . CARDIAC CATHETERIZATION    . CHOLECYSTECTOMY  1985  . EXCISIONAL  HEMORRHOIDECTOMY  1970  . TONSILLECTOMY     as child  . TOTAL KNEE ARTHROPLASTY  11/05/2012   Procedure: TOTAL KNEE ARTHROPLASTY;  Surgeon: Shelda PalMatthew D Olin, MD;  Location: [facility];  Service: Orthopedics;  Laterality: Right;  right      Current Outpatient Medications  Medication Sig Dispense Refill  . docusate sodium 100 MG CAPS Take 100 mg by mouth 2 (two) times daily. 10 capsule   . metoprolol succinate (TOPROL-XL) 25 MG 24 hr tablet Take 25 mg by mouth as needed (HEART RATE).      No current facility-administered medications for this visit.     Allergies:   Fluticasone; Codeine; and Other    Social History:  The patient  reports that she has never smoked. She has never used smokeless tobacco. She reports current alcohol use. She reports that she does not use drugs.   Family History:  The patient's family history includes Heart attack in her mother.    ROS:  Please see the history of present illness.   Otherwise, review of systems are positive for palpitations.   All other systems are reviewed and negative.    PHYSICAL EXAM: VS:  BP (!) 142/90   Pulse 61   Ht 5\' 4"  (1.626  m)   Wt 240 lb 12.8 oz (109.2 kg)   SpO2 95%   BMI 41.33 kg/m  , BMI Body mass index is 41.33 kg/m. GEN: Well nourished, well developed, in no acute distress  HEENT: normal  Neck: no JVD, carotid bruits, or masses Cardiac: RRR; no murmurs, rubs, or gallops,no edema  Respiratory:  clear to auscultation bilaterally, normal work of breathing GI: soft, nontender, nondistended, + BS MS: no deformity or atrophy  Skin: warm and dry, no rash; spider veins at the ankles Neuro:  Strength and sensation are intact Psych: euthymic mood, full affect   EKG:   The ekg ordered today demonstrates NSR, poor R wave progression, no ST changes; no change from prior   Recent Labs: No results found for requested labs within last 8760 hours.   Lipid Panel    Component Value Date/Time   CHOL 175 05/21/2009 1040    TRIG 162.0 (H) 05/21/2009 1040   HDL 45.50 05/21/2009 1040   CHOLHDL 4 05/21/2009 1040   VLDL 32.4 05/21/2009 1040   LDLCALC 97 05/21/2009 1040   LDLDIRECT 140.9 01/28/2007 1023     Other studies Reviewed: Additional studies/ records that were reviewed today with results demonstrating: prior ECG reviewed.   ASSESSMENT AND PLAN:  1. SVT: Sx controlled. COntinue metoprolol.  Will refill today. 2. Family h/o CAD: Brother with MI and stent. She has no ischemic sx. 3. Hyperlipidemia: LDL has been high in the past.  Will recheck labs.   4. 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    Current medicines are reviewed at length with the patient today.  The patient concerns regarding her medicines were addressed.  The following changes have been made:  No change  Labs/ tests ordered today include:  No orders of the defined types were placed in this encounter.   Recommend 150 minutes/week of aerobic exercise Low fat, low carb, high fiber diet recommended  Disposition:   FU in 1 year   Signed, Larae Grooms, MD  04/14/2019 1:49 PM    [facility] [place], [place], [place]  [address] Phone: [phone]; Fax: [phone]

## 2019-04-14 NOTE — Telephone Encounter (Signed)
   Patient scheduled for labs on 04/17/19. Patient answered no to all questions below. PATIENT IS AWARE THAT SHE NEEDS TO WEAR A FACE MASK (NOT JUST A SHIELD) IN ORDER TO BE SEEN.  COVID-19 Pre-Screening Questions:  . In the past 7 to 10 days have you had a cough,  shortness of breath, headache, congestion, fever (100 or greater) body aches, chills, sore throat, or sudden loss of taste or sense of smell? NO . Have you been around anyone with known Covid 19? NO . Have you been around anyone who is awaiting Covid 19 test results in the past 7 to 10 days? NO . Have you been around anyone who has been exposed to Covid 19, or has mentioned symptoms of Covid 19 within the past 7 to 10 days? NO

## 2019-04-17 ENCOUNTER — Other Ambulatory Visit: Payer: Self-pay

## 2019-04-17 ENCOUNTER — Other Ambulatory Visit: Payer: Medicare Other | Admitting: *Deleted

## 2019-04-17 DIAGNOSIS — E782 Mixed hyperlipidemia: Secondary | ICD-10-CM | POA: Diagnosis not present

## 2019-04-17 DIAGNOSIS — Z8249 Family history of ischemic heart disease and other diseases of the circulatory system: Secondary | ICD-10-CM

## 2019-04-17 DIAGNOSIS — I471 Supraventricular tachycardia, unspecified: Secondary | ICD-10-CM

## 2019-04-17 LAB — COMPREHENSIVE METABOLIC PANEL
ALT: 10 IU/L (ref 0–32)
AST: 13 IU/L (ref 0–40)
Albumin/Globulin Ratio: 2 (ref 1.2–2.2)
Albumin: 4.2 g/dL (ref 3.7–4.7)
Alkaline Phosphatase: 64 IU/L (ref 39–117)
BUN/Creatinine Ratio: 18 (ref 12–28)
BUN: 14 mg/dL (ref 8–27)
Bilirubin Total: 0.7 mg/dL (ref 0.0–1.2)
CO2: 24 mmol/L (ref 20–29)
Calcium: 9.3 mg/dL (ref 8.7–10.3)
Chloride: 107 mmol/L — ABNORMAL HIGH (ref 96–106)
Creatinine, Ser: 0.77 mg/dL (ref 0.57–1.00)
GFR calc Af Amer: 87 mL/min/{1.73_m2} (ref >59)
GFR calc non Af Amer: 75 mL/min/{1.73_m2} (ref >59)
Globulin, Total: 2.1 g/dL (ref 1.5–4.5)
Glucose: 117 mg/dL — ABNORMAL HIGH (ref 65–99)
Potassium: 4.3 mmol/L (ref 3.5–5.2)
Sodium: 145 mmol/L — ABNORMAL HIGH (ref 134–144)
Total Protein: 6.3 g/dL (ref 6.0–8.5)

## 2019-04-17 LAB — LIPID PANEL
Chol/HDL Ratio: 3.2 ratio (ref 0.0–4.4)
Cholesterol, Total: 203 mg/dL — ABNORMAL HIGH (ref 100–199)
HDL: 63 mg/dL (ref >39)
LDL Calculated: 118 mg/dL — ABNORMAL HIGH (ref 0–99)
Triglycerides: 111 mg/dL (ref 0–149)
VLDL Cholesterol Cal: 22 mg/dL (ref 5–40)

## 2019-06-25 DIAGNOSIS — Z1211 Encounter for screening for malignant neoplasm of colon: Secondary | ICD-10-CM | POA: Diagnosis not present

## 2019-06-25 DIAGNOSIS — Z0000 Encounter for general adult medical examination without abnormal findings: Secondary | ICD-10-CM | POA: Diagnosis not present

## 2020-04-06 DIAGNOSIS — H52223 Regular astigmatism, bilateral: Secondary | ICD-10-CM | POA: Diagnosis not present

## 2020-04-06 DIAGNOSIS — H25813 Combined forms of age-related cataract, bilateral: Secondary | ICD-10-CM | POA: Diagnosis not present

## 2020-04-06 DIAGNOSIS — H524 Presbyopia: Secondary | ICD-10-CM | POA: Diagnosis not present

## 2020-04-06 DIAGNOSIS — H04123 Dry eye syndrome of bilateral lacrimal glands: Secondary | ICD-10-CM | POA: Diagnosis not present

## 2020-07-23 ENCOUNTER — Telehealth: Payer: Self-pay | Admitting: Interventional Cardiology

## 2020-07-23 ENCOUNTER — Other Ambulatory Visit: Payer: Self-pay

## 2020-07-23 MED ORDER — METOPROLOL SUCCINATE ER 25 MG PO TB24: 25.0000 mg | ORAL_TABLET | ORAL | 0 refills | Status: DC | PRN

## 2020-07-23 NOTE — Telephone Encounter (Signed)
°*  STAT* If patient is at the pharmacy, call can be transferred to refill team.   1. Which medications need to be refilled? (please list name of each medication and dose if known) Metoprolol 25 mg  2. Which pharmacy/location (including street and city if local pharmacy) is medication to be sent to? [facility]  3. Do they need a 30 day or 90 day supply? 90

## 2020-09-01 ENCOUNTER — Ambulatory Visit: Payer: PRIVATE HEALTH INSURANCE | Admitting: Interventional Cardiology

## 2020-09-02 NOTE — Progress Notes (Signed)
Cardiology Office Note   Date:  09/03/2020   ID:  Anita Moore, DOB 10/10/1941, MRN [id number]  PCP:  Anita Rua, MD    No chief complaint on file.  SVT  Wt Readings from Last 3 Encounters:  09/03/20 244 lb 9.6 oz (110.9 kg)  04/14/19 240 lb 12.8 oz (109.2 kg)  12/04/16 245 lb 12.8 oz (111.5 kg)       History of Present Illness: Anita Moore is a 79 y.o. female   Who has a h/o SVT controlled on medications.   In July, she was elected to a [employer] in [place] in July 2017. THat night, she had an epsiode of SVT. Since then, she has noticed that whenever she goes there, she has stress and the SVT will come back. She has relief by taking her prn metoprolol. She relaxed and drank water.   Since that time, she has changed her attitude towards the [employer]. She realizes their things out of her control. This has helped her stay more relaxed.  SVT decreased.  THis has still been a source of stress.  Her brother had an MI in 2017. He was treated with a stent and he is doing better.  He continues to do well.   In more recent weeks in 2020, she has had more SVT, up to 158 bpm.  Resolved after taking metoprolol prn dose within a few hours along with rest; typically resolves 45. One episode occurred with wearing a mask and feeling that she did not get air.    In the last year, she has had rare palpitations.  Resolve with the metoprolol within minutes.    Walks the dog for activity. Feels well with that activity.   Home readings are well controlled.   Denies : Chest pain. Dizziness. Leg edema. Nitroglycerin use. Orthopnea. Palpitations. Paroxysmal nocturnal dyspnea. Shortness of breath. Syncope.       Past Medical History:  Diagnosis Date  . Arthritis 1990   osteoarthris knees  . Bilateral shoulder pain   . Dizziness and giddiness   . DJD (degenerative joint disease)    RIGHT KNEE  . Dysrhythmia 2010   SVT  . Insomnia   . Obesity    . Paroxysmal supraventricular tachycardia ([facility]) 05/07/2014    Past Surgical History:  Procedure Laterality Date  . CARDIAC CATHETERIZATION    . CHOLECYSTECTOMY  1985  . EXCISIONAL HEMORRHOIDECTOMY  1970  . TONSILLECTOMY     as child  . TOTAL KNEE ARTHROPLASTY  11/05/2012   Procedure: TOTAL KNEE ARTHROPLASTY;  Surgeon: [name], MD;  Location: [facility];  Service: Orthopedics;  Laterality: Right;  right      Current Outpatient Medications  Medication Sig Dispense Refill  . docusate sodium 100 MG CAPS Take 100 mg by mouth 2 (two) times daily. 10 capsule   . metoprolol succinate (TOPROL-XL) 25 MG 24 hr tablet Take 1 tablet (25 mg total) by mouth as needed (HEART RATE). Pt needs to keep upcoming appt in Oct for further refills 30 tablet 0   No current facility-administered medications for this visit.    Allergies:   Fluticasone, Codeine, and Other    Social History:  The patient  reports that she has never smoked. She has never used smokeless tobacco. She reports current alcohol use. She reports that she does not use drugs.   Family History:  The patient's family history includes Heart attack in her mother.    ROS:  Please see the history of present illness.   Otherwise, review of systems are positive for foot pain.   All other systems are reviewed and negative.    PHYSICAL EXAM: VS:  BP (!) 152/90   Pulse 92   Ht 5\' 4"  (1.626 m)   Wt 244 lb 9.6 oz (110.9 kg)   SpO2 92%   BMI 41.99 kg/m  , BMI Body mass index is 41.99 kg/m. GEN: Well nourished, well developed, in no acute distress  HEENT: normal  Neck: no JVD, carotid bruits, or masses Cardiac: RRR; no murmurs, rubs, or gallops,no edema  Respiratory:  clear to auscultation bilaterally, normal work of breathing GI: soft, nontender, nondistended, + BS MS: no deformity or atrophy  Skin: warm and dry, no rash Neuro:  Strength and sensation are intact Psych: euthymic mood, full affect   EKG:   The ekg ordered today  demonstrates NSR, nonspecific ST changes   Recent Labs: No results found for requested labs within last 8760 hours.   Lipid Panel    Component Value Date/Time   CHOL 203 (H) 04/17/2019 0822   TRIG 111 04/17/2019 0822   HDL 63 04/17/2019 0822   CHOLHDL 3.2 04/17/2019 0822   CHOLHDL 4 05/21/2009 1040   VLDL 32.4 05/21/2009 1040   LDLCALC 118 (H) 04/17/2019 0822   LDLDIRECT 140.9 01/28/2007 1023     Other studies Reviewed: Additional studies/ records that were reviewed today with results demonstrating: labs reviewed labs reviewed.   ASSESSMENT AND PLAN:  1. SVT: Sx well controlled.  COntinue metoprolol.   2. Family h/o CAD: Brother with MI. Healthy diet and lifestyle recommended. 3. Hyperipidemia:  Check lipids.  Healthy diet.   4. Elevated blood sugar: Check A1C 5. Recommended that she follow with her PMD for routine health care screening like mammograms etc...   Current medicines are reviewed at length with the patient today.  The patient concerns regarding her medicines were addressed.  The following changes have been made:  No change  Labs/ tests ordered today include: CMet, lipids A1C No orders of the defined types were placed in this encounter.   Recommend 150 minutes/week of aerobic exercise Low fat, low carb, high fiber diet recommended  Disposition:   FU in 1 year   Signed, 01/30/2007, MD  09/03/2020 1:30 PM    [facility] [address] [place], [place], [place]  [place] Phone: [phone]; Fax: [phone]

## 2020-09-03 ENCOUNTER — Other Ambulatory Visit: Payer: Self-pay

## 2020-09-03 ENCOUNTER — Ambulatory Visit (INDEPENDENT_AMBULATORY_CARE_PROVIDER_SITE_OTHER): Payer: Medicare Other | Admitting: Interventional Cardiology

## 2020-09-03 ENCOUNTER — Encounter: Payer: Self-pay | Admitting: Interventional Cardiology

## 2020-09-03 VITALS — BP 152/90 | HR 92 | Ht 64.0 in | Wt 244.6 lb

## 2020-09-03 DIAGNOSIS — Z8249 Family history of ischemic heart disease and other diseases of the circulatory system: Secondary | ICD-10-CM | POA: Diagnosis not present

## 2020-09-03 DIAGNOSIS — E782 Mixed hyperlipidemia: Secondary | ICD-10-CM

## 2020-09-03 DIAGNOSIS — R739 Hyperglycemia, unspecified: Secondary | ICD-10-CM | POA: Diagnosis not present

## 2020-09-03 DIAGNOSIS — I471 Supraventricular tachycardia: Secondary | ICD-10-CM

## 2020-09-03 NOTE — Patient Instructions (Signed)
Medication Instructions:  Your physician recommends that you continue on your current medications as directed. Please refer to the Current Medication list given to you today.  *If you need a refill on your cardiac medications before your next appointment, please call your pharmacy*   Lab Work: Please return within a week for FASTING labs CMET,LIPIDS,A1C If you have labs (blood work) drawn today and your tests are completely normal, you will receive your results only by: Marland Kitchen MyChart Message (if you have MyChart) OR . A paper copy in the mail If you have any lab test that is abnormal or we need to change your treatment, we will call you to review the results.   Testing/Procedures: None today   Follow-Up: At [facility], you and your health needs are our priority.  As part of our continuing mission to provide you with exceptional heart care, we have created designated Provider Care Teams.  These Care Teams include your primary Cardiologist (physician) and Advanced Practice Providers (APPs -  Physician Assistants and Nurse Practitioners) who all work together to provide you with the care you need, when you need it.  We recommend signing up for the patient portal called "MyChart".  Sign up information is provided on this After Visit Summary.  MyChart is used to connect with patients for Virtual Visits (Telemedicine).  Patients are able to view lab/test results, encounter notes, upcoming appointments, etc.  Non-urgent messages can be sent to your provider as well.   To learn more about what you can do with MyChart, go to [email or web address].    Your next appointment:   1 year(s)  The format for your next appointment:   In Person  Provider:   You may see Lance Muss, MD or one of the following Advanced Practice Providers on your designated Care Team:    Ronie Spies, PA-C  Jacolyn Reedy, PA-C

## 2020-09-16 ENCOUNTER — Other Ambulatory Visit: Payer: Medicare Other | Admitting: *Deleted

## 2020-09-16 ENCOUNTER — Other Ambulatory Visit: Payer: Self-pay

## 2020-09-16 DIAGNOSIS — E782 Mixed hyperlipidemia: Secondary | ICD-10-CM

## 2020-09-16 DIAGNOSIS — Z8249 Family history of ischemic heart disease and other diseases of the circulatory system: Secondary | ICD-10-CM | POA: Diagnosis not present

## 2020-09-16 DIAGNOSIS — R739 Hyperglycemia, unspecified: Secondary | ICD-10-CM | POA: Diagnosis not present

## 2020-09-17 LAB — COMPREHENSIVE METABOLIC PANEL
ALT: 8 IU/L (ref 0–32)
AST: 13 IU/L (ref 0–40)
Albumin/Globulin Ratio: 1.8 (ref 1.2–2.2)
Albumin: 4.1 g/dL (ref 3.7–4.7)
Alkaline Phosphatase: 74 IU/L (ref 44–121)
BUN/Creatinine Ratio: 20 (ref 12–28)
BUN: 14 mg/dL (ref 8–27)
Bilirubin Total: 0.6 mg/dL (ref 0.0–1.2)
CO2: 23 mmol/L (ref 20–29)
Calcium: 9.1 mg/dL (ref 8.7–10.3)
Chloride: 108 mmol/L — ABNORMAL HIGH (ref 96–106)
Creatinine, Ser: 0.7 mg/dL (ref 0.57–1.00)
GFR calc Af Amer: 97 mL/min/{1.73_m2} (ref >59)
GFR calc non Af Amer: 84 mL/min/{1.73_m2} (ref >59)
Globulin, Total: 2.3 g/dL (ref 1.5–4.5)
Glucose: 118 mg/dL — ABNORMAL HIGH (ref 65–99)
Potassium: 4 mmol/L (ref 3.5–5.2)
Sodium: 143 mmol/L (ref 134–144)
Total Protein: 6.4 g/dL (ref 6.0–8.5)

## 2020-09-17 LAB — HEMOGLOBIN A1C
Est. average glucose Bld gHb Est-mCnc: 131 mg/dL
Hgb A1c MFr Bld: 6.2 % — ABNORMAL HIGH (ref 4.8–5.6)

## 2020-09-17 LAB — LIPID PANEL
Chol/HDL Ratio: 3.3 ratio (ref 0.0–4.4)
Cholesterol, Total: 201 mg/dL — ABNORMAL HIGH (ref 100–199)
HDL: 61 mg/dL (ref >39)
LDL Chol Calc (NIH): 124 mg/dL — ABNORMAL HIGH (ref 0–99)
Triglycerides: 90 mg/dL (ref 0–149)
VLDL Cholesterol Cal: 16 mg/dL (ref 5–40)

## 2020-09-24 DIAGNOSIS — H04123 Dry eye syndrome of bilateral lacrimal glands: Secondary | ICD-10-CM | POA: Diagnosis not present

## 2020-09-24 DIAGNOSIS — H524 Presbyopia: Secondary | ICD-10-CM | POA: Diagnosis not present

## 2020-09-24 DIAGNOSIS — H25813 Combined forms of age-related cataract, bilateral: Secondary | ICD-10-CM | POA: Diagnosis not present

## 2020-09-24 DIAGNOSIS — H35363 Drusen (degenerative) of macula, bilateral: Secondary | ICD-10-CM | POA: Diagnosis not present

## 2020-09-24 DIAGNOSIS — H52223 Regular astigmatism, bilateral: Secondary | ICD-10-CM | POA: Diagnosis not present

## 2021-01-03 ENCOUNTER — Telehealth: Payer: Self-pay | Admitting: Interventional Cardiology

## 2021-01-03 NOTE — Telephone Encounter (Signed)
I would recommend the COVID vaccine for her.  I think all of the vaccines are good.  I think the Pfizer vaccine may have a slightly lower side effect rate than the others.   JV

## 2021-01-03 NOTE — Telephone Encounter (Signed)
Patient would like to know whether she should get the covid vaccine and if so which one Dr. Eldridge Dace recommends.

## 2021-01-05 NOTE — Telephone Encounter (Signed)
I spoke with patient and gave her information from Dr Varanasi.  

## 2021-02-21 DIAGNOSIS — R609 Edema, unspecified: Secondary | ICD-10-CM | POA: Diagnosis not present

## 2021-02-23 DIAGNOSIS — R600 Localized edema: Secondary | ICD-10-CM | POA: Diagnosis not present

## 2021-02-23 DIAGNOSIS — R609 Edema, unspecified: Secondary | ICD-10-CM | POA: Diagnosis not present

## 2021-04-07 ENCOUNTER — Telehealth: Payer: Self-pay | Admitting: Pharmacist

## 2021-04-07 ENCOUNTER — Other Ambulatory Visit: Payer: Self-pay | Admitting: Interventional Cardiology

## 2021-04-07 NOTE — Telephone Encounter (Signed)
[facility] called for clarification on metoprolol dose. I advised that she is to take 1 as needed for SVT

## 2021-05-25 DIAGNOSIS — R300 Dysuria: Secondary | ICD-10-CM | POA: Diagnosis not present

## 2021-07-29 DIAGNOSIS — R102 Pelvic and perineal pain: Secondary | ICD-10-CM | POA: Diagnosis not present

## 2021-08-31 DIAGNOSIS — Z1211 Encounter for screening for malignant neoplasm of colon: Secondary | ICD-10-CM | POA: Diagnosis not present

## 2021-08-31 DIAGNOSIS — K649 Unspecified hemorrhoids: Secondary | ICD-10-CM | POA: Diagnosis not present

## 2021-09-05 DIAGNOSIS — K649 Unspecified hemorrhoids: Secondary | ICD-10-CM | POA: Diagnosis not present

## 2021-09-05 DIAGNOSIS — K5900 Constipation, unspecified: Secondary | ICD-10-CM | POA: Diagnosis not present

## 2021-09-05 DIAGNOSIS — Z1211 Encounter for screening for malignant neoplasm of colon: Secondary | ICD-10-CM | POA: Diagnosis not present

## 2021-09-27 DIAGNOSIS — R102 Pelvic and perineal pain: Secondary | ICD-10-CM | POA: Diagnosis not present

## 2021-11-04 DIAGNOSIS — J329 Chronic sinusitis, unspecified: Secondary | ICD-10-CM | POA: Diagnosis not present

## 2021-11-04 DIAGNOSIS — J069 Acute upper respiratory infection, unspecified: Secondary | ICD-10-CM | POA: Diagnosis not present

## 2021-11-04 DIAGNOSIS — Z03818 Encounter for observation for suspected exposure to other biological agents ruled out: Secondary | ICD-10-CM | POA: Diagnosis not present

## 2021-11-04 DIAGNOSIS — R051 Acute cough: Secondary | ICD-10-CM | POA: Diagnosis not present

## 2021-11-08 ENCOUNTER — Telehealth: Payer: Self-pay | Admitting: Interventional Cardiology

## 2021-11-08 NOTE — Telephone Encounter (Signed)
Patient said she tested positive for COVID 11/04/21. The patient said her symptoms have gotten better day by day.  She wanted to know if there was any special medication she needs to take to protect her heart.   Please advise

## 2021-11-08 NOTE — Telephone Encounter (Signed)
I spoke with patient. She is tired but starting to feel better.  She has been prescribed Paxlovid by PCP but has decided not to take.  She has made PCP aware of this.  Patient will follow up with PCP as needed.

## 2021-11-14 ENCOUNTER — Other Ambulatory Visit: Payer: Self-pay | Admitting: Physician Assistant

## 2021-11-14 DIAGNOSIS — M79605 Pain in left leg: Secondary | ICD-10-CM

## 2021-11-16 ENCOUNTER — Other Ambulatory Visit: Payer: Medicare Other

## 2021-11-22 ENCOUNTER — Ambulatory Visit
Admission: RE | Admit: 2021-11-22 | Discharge: 2021-11-22 | Disposition: A | Discharge status: Home / Self Care | Payer: Medicare Other | Source: Ambulatory Visit | Attending: Physician Assistant | Admitting: Physician Assistant

## 2021-11-22 DIAGNOSIS — M79605 Pain in left leg: Secondary | ICD-10-CM | POA: Diagnosis not present

## 2021-11-22 DIAGNOSIS — I82812 Embolism and thrombosis of superficial veins of left lower extremities: Secondary | ICD-10-CM | POA: Diagnosis not present

## 2021-12-08 NOTE — Progress Notes (Signed)
Cardiology Office Note   Date:  12/09/2021   ID:  Anita Moore, DOB 12-30-1940, MRN [id number]  PCP:  Orpah Melter, MD    Chief Complaint  Patient presents with   Follow-up   SVT  Wt Readings from Last 3 Encounters:  12/09/21 228 lb (103.4 kg)  09/03/20 244 lb 9.6 oz (110.9 kg)  04/14/19 240 lb 12.8 oz (109.2 kg)       History of Present Illness: Anita Moore is a 81 y.o. female    Who has a h/o SVT controlled on medications.  She is the mother of Anita Moore who was Facilities manager at [employer] [place] [employer].     In July, she was elected to a [employer] in [place] in July 2017.  THat night, she had an epsiode of SVT.  Since then, she has noticed that whenever she goes there, she has stress and the SVT will come back.  She has relief by taking her prn metoprolol.  She relaxed and drank water.     Since that time, she has changed her attitude towards the [employer]. She realizes their things out of her control. This has helped her stay more relaxed.  SVT decreased.  THis has still been a source of stress.   Her brother had an MI in 2017. He was treated with a stent and he is doing better.  He continues to do well.    In more recent weeks in 2020, she has had more SVT, up to 158 bpm.  Resolved after taking metoprolol prn dose within a few hours along with rest; typically resolves 45. One episode occurred with wearing a mask and feeling that she did not get air.    She had COVID in Dec 2022.  Sx lasted 4 weeks. Most severe sx was shakiness and sleepiness.   Denies : Chest pain. Dizziness. Leg edema. Nitroglycerin use. Orthopnea. Paroxysmal nocturnal dyspnea. Shortness of breath. Syncope.         Past Medical History:  Diagnosis Date   Arthritis 1990   osteoarthris knees   Bilateral shoulder pain    Dizziness and giddiness    DJD (degenerative joint disease)    RIGHT KNEE   Dysrhythmia 2010   SVT   Insomnia    Obesity    Paroxysmal  supraventricular tachycardia ([place]) 05/07/2014    Past Surgical History:  Procedure Laterality Date   CARDIAC CATHETERIZATION     CHOLECYSTECTOMY  1985   EXCISIONAL HEMORRHOIDECTOMY  1970   TONSILLECTOMY     as child   TOTAL KNEE ARTHROPLASTY  11/05/2012   Procedure: TOTAL KNEE ARTHROPLASTY;  Surgeon: Mauri Pole, MD;  Location: [facility];  Service: Orthopedics;  Laterality: Right;  right      Current Outpatient Medications  Medication Sig Dispense Refill   Ascorbic Acid (VITAMIN C) 100 MG tablet Take 100 mg by mouth daily.     cholecalciferol (VITAMIN D3) 25 MCG (1000 UNIT) tablet Take 1,000 Units by mouth daily.     docusate sodium 100 MG CAPS Take 100 mg by mouth 2 (two) times daily. 10 capsule    metoprolol succinate (TOPROL-XL) 25 MG 24 hr tablet Take 1 tablet (25 mg total) by mouth as needed (for Heart Rate). 30 tablet 2   zinc gluconate 50 MG tablet Take 50 mg by mouth daily.     No current facility-administered medications for this visit.    Allergies:   Fluticasone, Codeine, and Other  Social History:  The patient  reports that she has never smoked. She has never used smokeless tobacco. She reports current alcohol use. She reports that she does not use drugs.   Family History:  The patient's family history includes Heart attack in her mother.    ROS:  Please see the history of present illness.   Otherwise, review of systems are positive for recent fatigue with COVID.   All other systems are reviewed and negative.    PHYSICAL EXAM: VS:  BP (!) 142/86    Pulse 100    Ht 5\' 4"  (1.626 m)    Wt 228 lb (103.4 kg)    SpO2 98%    BMI 39.14 kg/m  , BMI Body mass index is 39.14 kg/m. GEN: Well nourished, well developed, in no acute distress HEENT: normal Neck: no JVD, carotid bruits, or masses Cardiac: RRR; no murmurs, rubs, or gallops,no edema  Respiratory:  clear to auscultation bilaterally, normal work of breathing GI: soft, nontender, nondistended, + BS MS: no  deformity or atrophy Skin: warm and dry, no rash Neuro:  Strength and sensation are intact Psych: euthymic mood, full affect   EKG:   The ekg ordered today demonstrates NSR, nonspecific ST-T wave changes   Recent Labs: No results found for requested labs within last 8760 hours.   Lipid Panel    Component Value Date/Time   CHOL 201 (H) 09/16/2020 0825   TRIG 90 09/16/2020 0825   HDL 61 09/16/2020 0825   CHOLHDL 3.3 09/16/2020 0825   CHOLHDL 4 05/21/2009 1040   VLDL 32.4 05/21/2009 1040   LDLCALC 124 (H) 09/16/2020 0825   LDLDIRECT 140.9 01/28/2007 1023     Other studies Reviewed: Additional studies/ records that were reviewed today with results demonstrating: labs reviewed.   ASSESSMENT AND PLAN:  SVT: Sx have been very mild over the past year.  Refill Toprol. FAmily h/o CAD: Whole food, plant based diet.  Hyperlipidemia: LDL 124 in 2021.  Recheck lipids.  Would consider lipid-lowering therapy, or at least a calcium scoring CT if LDL increased. Elevated blood sugar: High-fiber diet to help.  We will also recheck A1c.  Try to get back into exercise. Needs to follow with her PMD for routine health care screening. BP was high since COVID when she checks at home.  Borderline now.  Hopefully will improve as time passes since her COVID infection.  She will let us know if systolics are consistently over 140 mmHg. PreDM: A1C: 6.2.  Healthy lifestyle.   Current medicines are reviewed at length with the patient today.  The patient concerns regarding her medicines were addressed.  The following changes have been made:  No change  Labs/ tests ordered today include:  No orders of the defined types were placed in this encounter.   Recommend 150 minutes/week of aerobic exercise Low fat, low carb, high fiber diet recommended  Disposition:   FU in 1 year   Signed, Larae Grooms, MD  12/09/2021 11:51 AM    [facility] [place], [place], [place]   [address] Phone: [phone]; Fax: [phone]

## 2021-12-09 ENCOUNTER — Other Ambulatory Visit: Payer: Self-pay

## 2021-12-09 ENCOUNTER — Ambulatory Visit (INDEPENDENT_AMBULATORY_CARE_PROVIDER_SITE_OTHER): Payer: Medicare Other | Admitting: Interventional Cardiology

## 2021-12-09 ENCOUNTER — Encounter: Payer: Self-pay | Admitting: Interventional Cardiology

## 2021-12-09 VITALS — BP 142/86 | HR 100 | Ht 64.0 in | Wt 228.0 lb

## 2021-12-09 DIAGNOSIS — E782 Mixed hyperlipidemia: Secondary | ICD-10-CM

## 2021-12-09 DIAGNOSIS — I471 Supraventricular tachycardia: Secondary | ICD-10-CM | POA: Diagnosis not present

## 2021-12-09 DIAGNOSIS — Z8249 Family history of ischemic heart disease and other diseases of the circulatory system: Secondary | ICD-10-CM

## 2021-12-09 DIAGNOSIS — R739 Hyperglycemia, unspecified: Secondary | ICD-10-CM | POA: Diagnosis not present

## 2021-12-09 MED ORDER — METOPROLOL SUCCINATE ER 25 MG PO TB24: 25.0000 mg | ORAL_TABLET | ORAL | 2 refills | Status: DC | PRN

## 2021-12-09 NOTE — Patient Instructions (Signed)
KEEP UP WITH YOUR BLOOD PRESSURES AND LET us KNOW IF YOUR SYSTOLIC IS CONSISTENTLY GREATER THAN 14  Medication Instructions:  Your physician recommends that you continue on your current medications as directed. Please refer to the Current Medication list given to you today.  *If you need a refill on your cardiac medications before your next appointment, please call your pharmacy*  Lab Work: Fasting lipids, CBC, A1C, and CMET on February 10th. Walk-in between 7:15am and 5:00pm  If you have labs (blood work) drawn today and your tests are completely normal, you will receive your results only by: MyChart Message (if you have MyChart) OR A paper copy in the mail If you have any lab test that is abnormal or we need to change your treatment, we will call you to review the results.  Follow-Up: At [facility], you and your health needs are our priority.  As part of our continuing mission to provide you with exceptional heart care, we have created designated Provider Care Teams.  These Care Teams include your primary Cardiologist (physician) and Advanced Practice Providers (APPs -  Physician Assistants and Nurse Practitioners) who all work together to provide you with the care you need, when you need it.  Your next appointment:   1 year(s)  The format for your next appointment:   In Person  Provider:   Lance Muss, MD

## 2021-12-14 NOTE — Addendum Note (Signed)
Addended by: Ulice Brilliant T on: 12/14/2021 08:27 AM   Modules accepted: Orders

## 2021-12-16 ENCOUNTER — Other Ambulatory Visit: Payer: Self-pay

## 2021-12-16 ENCOUNTER — Other Ambulatory Visit: Payer: Medicare Other | Admitting: *Deleted

## 2021-12-16 DIAGNOSIS — Z8249 Family history of ischemic heart disease and other diseases of the circulatory system: Secondary | ICD-10-CM

## 2021-12-16 DIAGNOSIS — R739 Hyperglycemia, unspecified: Secondary | ICD-10-CM | POA: Diagnosis not present

## 2021-12-16 DIAGNOSIS — E782 Mixed hyperlipidemia: Secondary | ICD-10-CM

## 2021-12-16 DIAGNOSIS — I471 Supraventricular tachycardia: Secondary | ICD-10-CM

## 2021-12-16 LAB — COMPREHENSIVE METABOLIC PANEL
ALT: 7 IU/L (ref 0–32)
AST: 13 IU/L (ref 0–40)
Albumin/Globulin Ratio: 1.8 (ref 1.2–2.2)
Albumin: 4.3 g/dL (ref 3.7–4.7)
Alkaline Phosphatase: 71 IU/L (ref 44–121)
BUN/Creatinine Ratio: 21 (ref 12–28)
BUN: 13 mg/dL (ref 8–27)
Bilirubin Total: 0.6 mg/dL (ref 0.0–1.2)
CO2: 24 mmol/L (ref 20–29)
Calcium: 9.4 mg/dL (ref 8.7–10.3)
Chloride: 104 mmol/L (ref 96–106)
Creatinine, Ser: 0.63 mg/dL (ref 0.57–1.00)
Globulin, Total: 2.4 g/dL (ref 1.5–4.5)
Glucose: 139 mg/dL — ABNORMAL HIGH (ref 70–99)
Potassium: 3.8 mmol/L (ref 3.5–5.2)
Sodium: 139 mmol/L (ref 134–144)
Total Protein: 6.7 g/dL (ref 6.0–8.5)
eGFR: 90 mL/min/{1.73_m2} (ref >59)

## 2021-12-16 LAB — LIPID PANEL
Chol/HDL Ratio: 3.3 ratio (ref 0.0–4.4)
Cholesterol, Total: 223 mg/dL — ABNORMAL HIGH (ref 100–199)
HDL: 68 mg/dL (ref >39)
LDL Chol Calc (NIH): 139 mg/dL — ABNORMAL HIGH (ref 0–99)
Triglycerides: 89 mg/dL (ref 0–149)
VLDL Cholesterol Cal: 16 mg/dL (ref 5–40)

## 2021-12-16 LAB — CBC
Hematocrit: 43.1 % (ref 34.0–46.6)
Hemoglobin: 14.7 g/dL (ref 11.1–15.9)
MCH: 29.1 pg (ref 26.6–33.0)
MCHC: 34.1 g/dL (ref 31.5–35.7)
MCV: 85 fL (ref 79–97)
Platelets: 283 10*3/uL (ref 150–450)
RBC: 5.05 x10E6/uL (ref 3.77–5.28)
RDW: 14.1 % (ref 11.7–15.4)
WBC: 6.8 10*3/uL (ref 3.4–10.8)

## 2021-12-16 LAB — HEMOGLOBIN A1C
Est. average glucose Bld gHb Est-mCnc: 154 mg/dL
Hgb A1c MFr Bld: 7 % — ABNORMAL HIGH (ref 4.8–5.6)

## 2021-12-21 ENCOUNTER — Telehealth: Payer: Self-pay

## 2021-12-21 DIAGNOSIS — E782 Mixed hyperlipidemia: Secondary | ICD-10-CM

## 2021-12-21 MED ORDER — ROSUVASTATIN CALCIUM 20 MG PO TABS: 20.0000 mg | ORAL_TABLET | Freq: Every day | ORAL | 3 refills | Status: DC

## 2021-12-21 NOTE — Telephone Encounter (Signed)
The patient has been notified of the result and verbalized understanding.  All questions (if any) were answered. Theresia Majors, RN 12/21/2021 4:55 PM  Rx has been sent in, labs have been scheduled.

## 2021-12-21 NOTE — Telephone Encounter (Signed)
-----   Message from Jettie Booze, MD sent at 12/20/2021 10:37 PM EST ----- Electrolytes and liver tests normal.  Glucose increased.  A1C now in the diabetic range. LDL increased to 139. I recommend that she start rosuvastatin 20 mg daily.  Repeat liver and lipids in 2-3 months. Please cc results to PMD for further DM management.  High fiber diet, 150 minutes of exercise/week.

## 2022-01-17 ENCOUNTER — Other Ambulatory Visit: Payer: Self-pay | Admitting: *Deleted

## 2022-01-17 MED ORDER — METOPROLOL SUCCINATE ER 25 MG PO TB24: 25.0000 mg | ORAL_TABLET | ORAL | 1 refills | Status: DC | PRN

## 2022-02-07 DIAGNOSIS — H04123 Dry eye syndrome of bilateral lacrimal glands: Secondary | ICD-10-CM | POA: Diagnosis not present

## 2022-02-07 DIAGNOSIS — H353131 Nonexudative age-related macular degeneration, bilateral, early dry stage: Secondary | ICD-10-CM | POA: Diagnosis not present

## 2022-02-07 DIAGNOSIS — H25813 Combined forms of age-related cataract, bilateral: Secondary | ICD-10-CM | POA: Diagnosis not present

## 2022-02-16 DIAGNOSIS — M791 Myalgia, unspecified site: Secondary | ICD-10-CM | POA: Diagnosis not present

## 2022-02-28 ENCOUNTER — Other Ambulatory Visit: Payer: Medicare Other

## 2022-03-22 ENCOUNTER — Other Ambulatory Visit: Payer: Medicare Other

## 2022-03-22 DIAGNOSIS — E782 Mixed hyperlipidemia: Secondary | ICD-10-CM

## 2022-03-22 LAB — ALT: ALT: 11 IU/L (ref 0–32)

## 2022-03-22 LAB — LIPID PANEL
Chol/HDL Ratio: 1.9 ratio (ref 0.0–4.4)
Cholesterol, Total: 124 mg/dL (ref 100–199)
HDL: 66 mg/dL (ref >39)
LDL Chol Calc (NIH): 40 mg/dL (ref 0–99)
Triglycerides: 99 mg/dL (ref 0–149)
VLDL Cholesterol Cal: 18 mg/dL (ref 5–40)

## 2022-04-23 IMAGING — US US EXTREM LOW VENOUS*L*
1 series · 14 of 24 positions shown · non-contrast
Comparison: None.

CLINICAL DATA: Lump on leg, left lower extremity pain.

EXAM:
LEFT LOWER EXTREMITY VENOUS DOPPLER ULTRASOUND
TECHNIQUE: Gray-scale sonography with compression, as well as color and duplex
ultrasound, were performed to evaluate the deep venous system(s)
from the level of the common femoral vein through the popliteal and
proximal calf veins.

[Series 1: us extrem low venous*left* · 0.10mm/px · 14 of 30 slices shown]
[im 1/30]
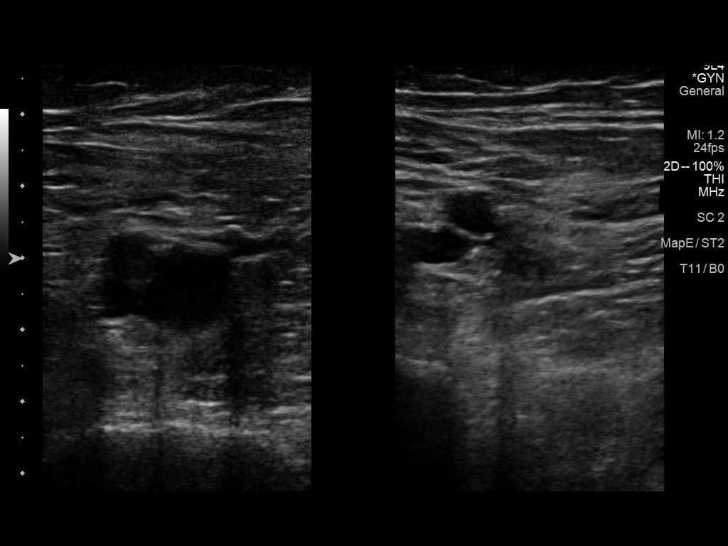
[im 3/30]
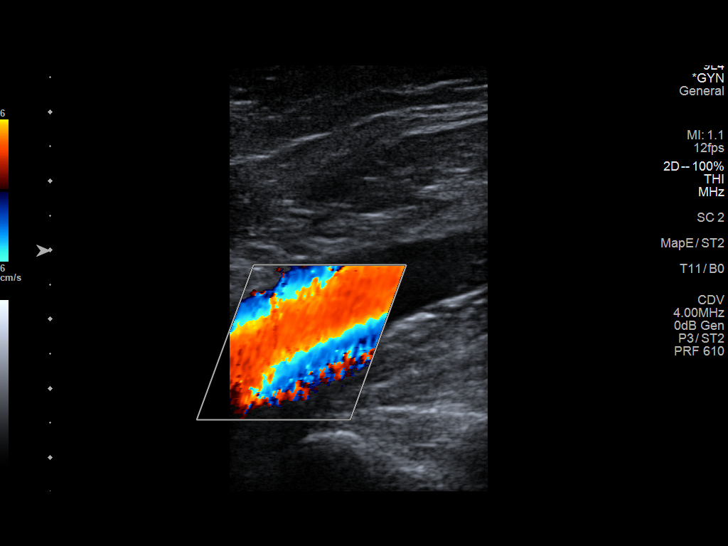
[im 6/30]
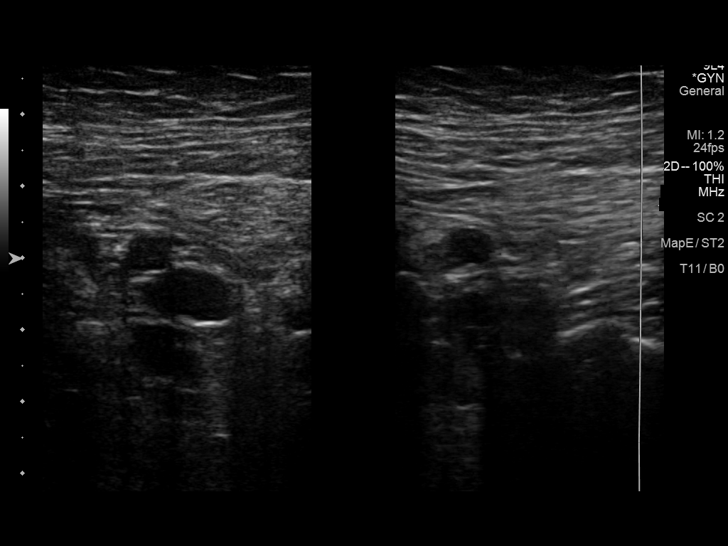
[im 8/30]
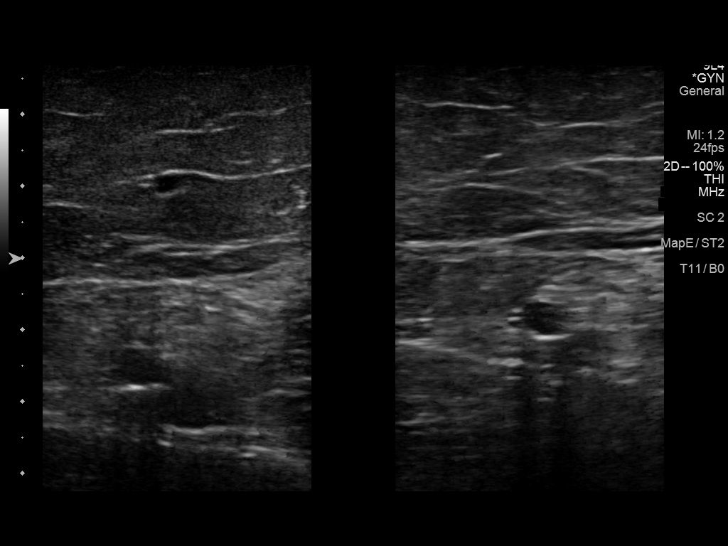
[im 9/30]
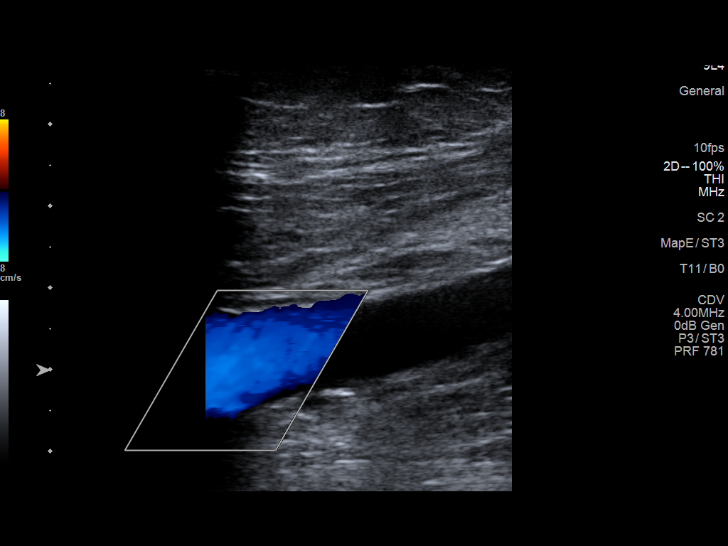
[im 12/30]
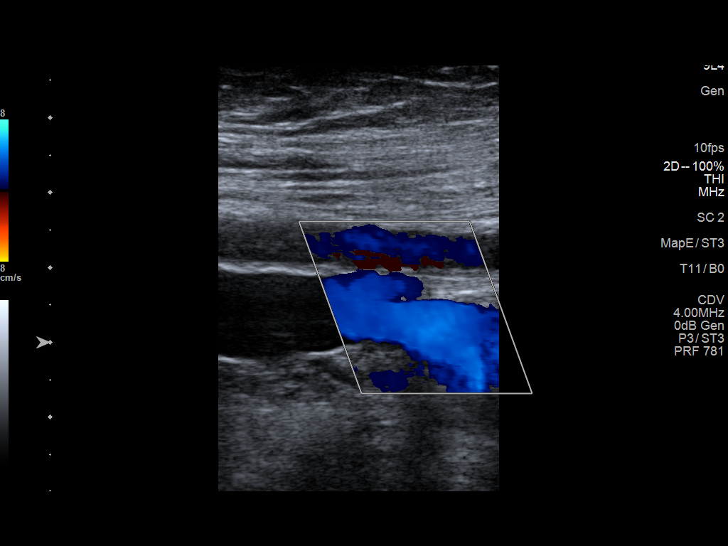
[im 14/30]
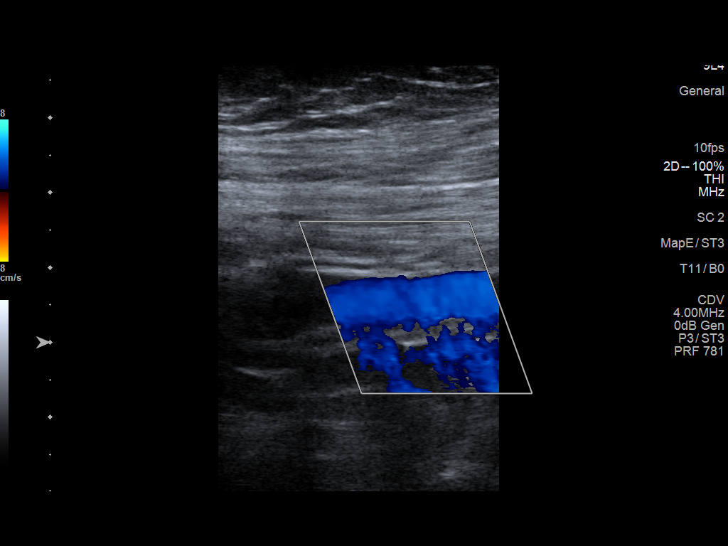
[im 16/30]
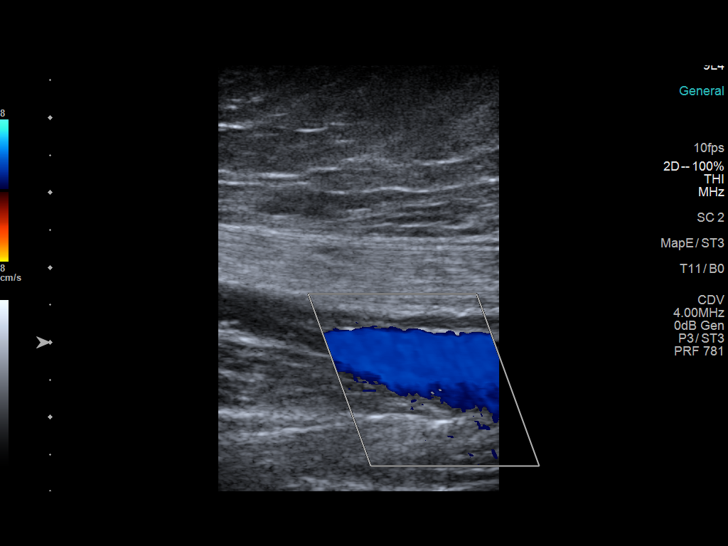
[im 18/30]
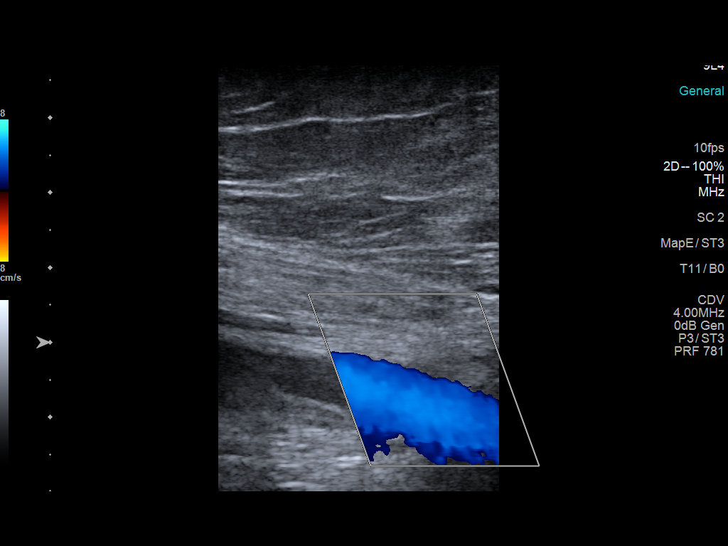
[im 21/30]
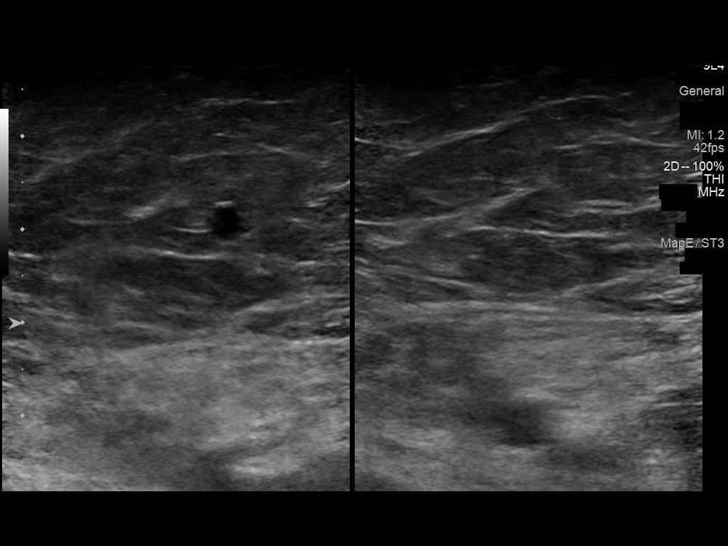
[im 23/30]
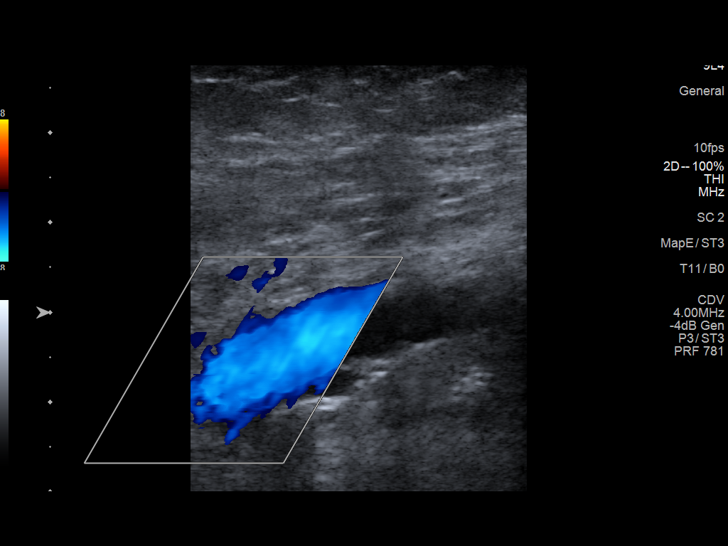
[im 24/30]
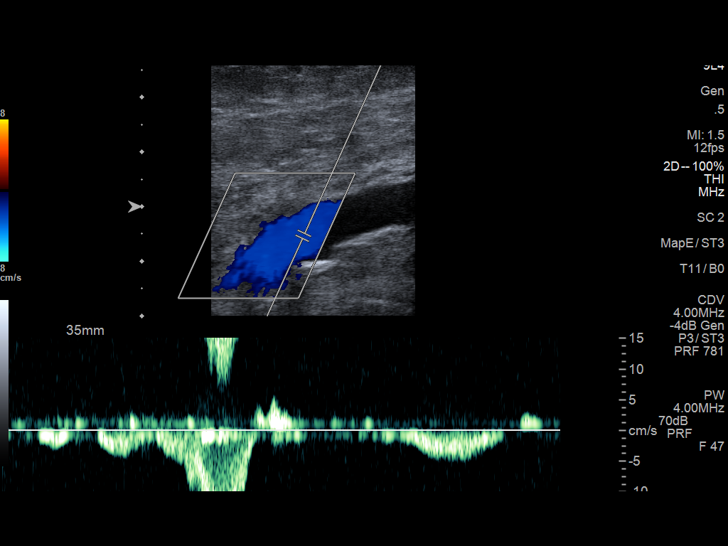
[im 27/30]
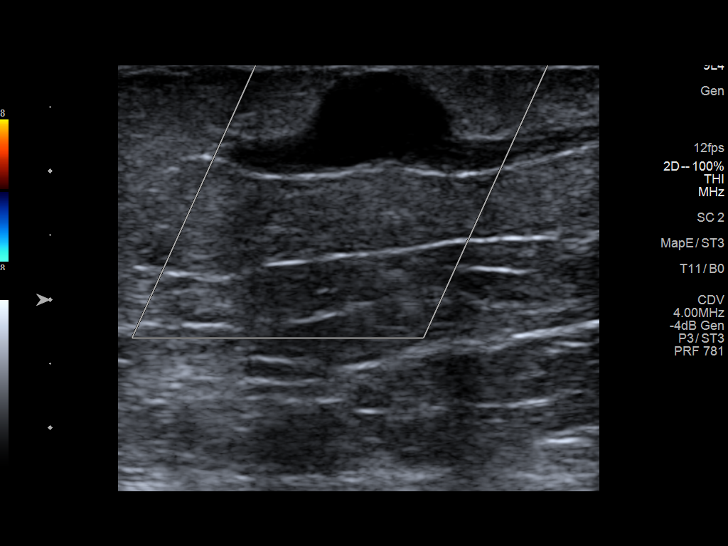
[im 30/30]
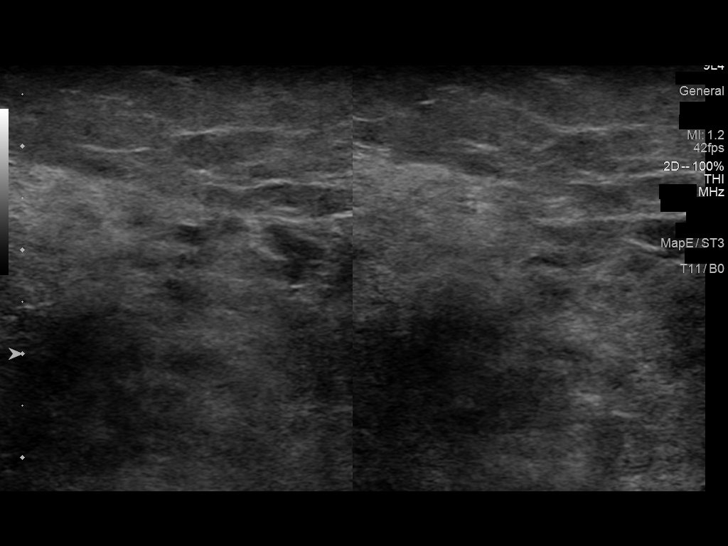

[14 of 24 positions shown; findings below may reference images not displayed]

FINDINGS: VENOUS

Normal compressibility of the common femoral, superficial femoral,
and popliteal veins, as well as the visualized calf veins.
Visualized portions of profunda femoral vein unremarkable. No
filling defects to suggest DVT on grayscale or color Doppler
imaging. Doppler waveforms show normal direction of venous flow,
normal respiratory plasticity and response to augmentation.

Limited views of the contralateral common femoral vein are
unremarkable.

There is superficial thrombus within the left greater saphenous vein
branch at the level of the knee.

OTHER

None.

Limitations: none
IMPRESSION: 1. Superficial thrombus in left greater saphenous vein branch at the
level of the knee.
2. No deep venous thrombosis identified in the left lower extremity.

## 2022-07-26 DIAGNOSIS — H2513 Age-related nuclear cataract, bilateral: Secondary | ICD-10-CM | POA: Diagnosis not present

## 2022-07-26 DIAGNOSIS — H25043 Posterior subcapsular polar age-related cataract, bilateral: Secondary | ICD-10-CM | POA: Diagnosis not present

## 2022-07-26 DIAGNOSIS — H18413 Arcus senilis, bilateral: Secondary | ICD-10-CM | POA: Diagnosis not present

## 2022-07-26 DIAGNOSIS — H25013 Cortical age-related cataract, bilateral: Secondary | ICD-10-CM | POA: Diagnosis not present

## 2022-07-26 DIAGNOSIS — H2512 Age-related nuclear cataract, left eye: Secondary | ICD-10-CM | POA: Diagnosis not present

## 2022-08-22 DIAGNOSIS — E785 Hyperlipidemia, unspecified: Secondary | ICD-10-CM | POA: Diagnosis not present

## 2022-08-22 DIAGNOSIS — I471 Supraventricular tachycardia, unspecified: Secondary | ICD-10-CM | POA: Diagnosis not present

## 2022-08-22 DIAGNOSIS — K649 Unspecified hemorrhoids: Secondary | ICD-10-CM | POA: Diagnosis not present

## 2022-08-22 DIAGNOSIS — Z0000 Encounter for general adult medical examination without abnormal findings: Secondary | ICD-10-CM | POA: Diagnosis not present

## 2022-08-22 DIAGNOSIS — I422 Other hypertrophic cardiomyopathy: Secondary | ICD-10-CM | POA: Diagnosis not present

## 2022-08-24 DIAGNOSIS — M25562 Pain in left knee: Secondary | ICD-10-CM | POA: Diagnosis not present

## 2022-08-24 DIAGNOSIS — M1712 Unilateral primary osteoarthritis, left knee: Secondary | ICD-10-CM | POA: Diagnosis not present

## 2022-08-24 DIAGNOSIS — Z96651 Presence of right artificial knee joint: Secondary | ICD-10-CM | POA: Diagnosis not present

## 2022-08-24 DIAGNOSIS — Z6841 Body Mass Index (BMI) 40.0 and over, adult: Secondary | ICD-10-CM | POA: Diagnosis not present

## 2022-09-19 DIAGNOSIS — H2512 Age-related nuclear cataract, left eye: Secondary | ICD-10-CM | POA: Diagnosis not present

## 2022-09-19 DIAGNOSIS — H2511 Age-related nuclear cataract, right eye: Secondary | ICD-10-CM | POA: Diagnosis not present

## 2022-09-19 DIAGNOSIS — H269 Unspecified cataract: Secondary | ICD-10-CM | POA: Diagnosis not present

## 2022-10-03 DIAGNOSIS — H2511 Age-related nuclear cataract, right eye: Secondary | ICD-10-CM | POA: Diagnosis not present

## 2022-10-03 DIAGNOSIS — H269 Unspecified cataract: Secondary | ICD-10-CM | POA: Diagnosis not present

## 2022-11-13 ENCOUNTER — Telehealth: Payer: Self-pay | Admitting: Interventional Cardiology

## 2022-11-13 MED ORDER — ROSUVASTATIN CALCIUM 20 MG PO TABS: 20.0000 mg | ORAL_TABLET | Freq: Every day | ORAL | 0 refills | Status: DC

## 2022-11-13 NOTE — Telephone Encounter (Signed)
Pt's medication was sent to pt's pharmacy as requested. Confirmation received.  °

## 2022-11-13 NOTE — Telephone Encounter (Signed)
*  STAT* If patient is at the pharmacy, call can be transferred to refill team.   1. Which medications need to be refilled? (please list name of each medication and dose if known)   rosuvastatin (CRESTOR) 20 MG tablet     2. Which pharmacy/location (including street and city if local pharmacy) is medication to be sent to?  [facility] # [place], [place]  3. Do they need a 30 day or 90 day supply?  90 day

## 2022-12-19 DIAGNOSIS — R059 Cough, unspecified: Secondary | ICD-10-CM | POA: Diagnosis not present

## 2023-02-06 NOTE — Progress Notes (Unsigned)
Cardiology Office Note   Date:  02/07/2023   ID:  Anita Moore, DOB 1941/08/01, MRN [id number]  PCP:  Orpah Melter, MD    No chief complaint on file.  SVT  Wt Readings from Last 3 Encounters:  02/07/23 233 lb (105.7 kg)  12/09/21 228 lb (103.4 kg)  09/03/20 244 lb 9.6 oz (110.9 kg)       History of Present Illness: Anita Moore is a 82 y.o. female   Who has a h/o SVT controlled on medications.  She is the mother of Anita Moore who was Facilities manager at [employer].     In July, she was elected to a [employer] in [place] in July 2017.  THat night, she had an epsiode of SVT.  Since then, she has noticed that whenever she goes there, she has stress and the SVT will come back.  She has relief by taking her prn metoprolol.  She relaxed and drank water.     Since that time, she has changed her attitude towards the [employer]. She realizes their things out of her control. This has helped her stay more relaxed.  SVT decreased.  THis has still been a source of stress.   Her brother had an MI in 2017. He was treated with a stent and he is doing better.  He continues to do well.    In more recent weeks in 2020, she has had more SVT, up to 158 bpm.  Resolved after taking metoprolol prn dose within a few hours along with rest; typically resolves 45. One episode occurred with wearing a mask and feeling that she did not get air.     She had COVID in Dec 2022.  Sx lasted 4 weeks. Most severe sx was shakiness and sleepiness.  BP was high after this.  She was going to follow this with home readings.  She uses a stationary bike.  15 minutes a day.  No sx with that activity.  Had cataract surgery in 11/23.   Denies : Chest pain. Dizziness. Leg edema. Nitroglycerin use. Orthopnea. Palpitations. Paroxysmal nocturnal dyspnea. Shortness of breath. Syncope.     Past Medical History:  Diagnosis Date   Arthritis 1990   osteoarthris knees   Bilateral shoulder pain     Dizziness and giddiness    DJD (degenerative joint disease)    RIGHT KNEE   Dysrhythmia 2010   SVT   Insomnia    Obesity    Paroxysmal supraventricular tachycardia 05/07/2014    Past Surgical History:  Procedure Laterality Date   CARDIAC CATHETERIZATION     CHOLECYSTECTOMY  1985   EXCISIONAL [place]     as child   TOTAL KNEE ARTHROPLASTY  11/05/2012   Procedure: TOTAL KNEE ARTHROPLASTY;  Surgeon: Mauri Pole, MD;  Location: [facility];  Service: Orthopedics;  Laterality: Right;  right      Current Outpatient Medications  Medication Sig Dispense Refill   hydrocortisone (ANUSOL-HC) 25 MG suppository Place rectally.     metoprolol succinate (TOPROL-XL) 25 MG 24 hr tablet Take 1 tablet (25 mg total) by mouth as needed (for Heart Rate). 90 tablet 1   PROCTO-MED HC 2.5 % rectal cream Apply topically.     rosuvastatin (CRESTOR) 20 MG tablet Take 1 tablet (20 mg total) by mouth daily. 90 tablet 0   Ascorbic Acid (VITAMIN C) 100 MG tablet Take 100 mg by mouth daily. (Patient not taking: Reported  on 02/07/2023)     cholecalciferol (VITAMIN D3) 25 MCG (1000 UNIT) tablet Take 1,000 Units by mouth daily. (Patient not taking: Reported on 02/07/2023)     docusate sodium 100 MG CAPS Take 100 mg by mouth 2 (two) times daily. (Patient not taking: Reported on 02/07/2023) 10 capsule    zinc gluconate 50 MG tablet Take 50 mg by mouth daily. (Patient not taking: Reported on 02/07/2023)     No current facility-administered medications for this visit.    Allergies:   Fluticasone, Codeine, and Other    Social History:  The patient  reports that she has never smoked. She has never used smokeless tobacco. She reports current alcohol use. She reports that she does not use drugs.   Family History:  The patient's family history includes Heart attack in her mother.    ROS:  Please see the history of present illness.   Otherwise, review of systems are positive for palpitations- .    All other systems are reviewed and negative.    PHYSICAL EXAM: VS:  BP (!) 202/82   Pulse 68   Ht 5\' 4"  (1.626 m)   Wt 233 lb (105.7 kg)   SpO2 95%   BMI 39.99 kg/m  , BMI Body mass index is 39.99 kg/m. GEN: Well nourished, well developed, in no acute distress HEENT: normal Neck: no JVD, carotid bruits, or masses Cardiac: RRR; no murmurs, rubs, or gallops,no edema  Respiratory:  clear to auscultation bilaterally, normal work of breathing GI: soft, nontender, nondistended, + BS, obese MS: no deformity or atrophy Skin: warm and dry, no rash Neuro:  Strength and sensation are intact Psych: euthymic mood, full affect   EKG:   The ekg ordered today demonstrates NSR, nonspecific ST changes   Recent Labs: 03/22/2022: ALT 11   Lipid Panel    Component Value Date/Time   CHOL 124 03/22/2022 0856   TRIG 99 03/22/2022 0856   HDL 66 03/22/2022 0856   CHOLHDL 1.9 03/22/2022 0856   CHOLHDL 4 05/21/2009 1040   VLDL 32.4 05/21/2009 1040   LDLCALC 40 03/22/2022 0856   LDLDIRECT 140.9 01/28/2007 1023     Other studies Reviewed: Additional studies/ records that were reviewed today with results demonstrating: labs reviewed.   ASSESSMENT AND PLAN:  SVT: Sx controlled.  Toprol XL prn to control SVT symptoms.  Does not have to use often.  She will let us know if symptoms get more frequent. Family history of CAD: Discussed testing for ischemia with CTA given some palpitations that radiate to the jaw.  She declined.  Brother with a stent in the last few years.  If symptoms get more frequent, she will reconsider CTA. Hyperlipidemia: LDL 40.  Whole food, plant-based diet.  High-fiber diet.  Avoid processed foods.  She is trying to increase fiber intake. Elevated blood sugar: A1C 7.0 in 2/23.  Does like to "eat junk."  Exercise limited by left knee arthritis.  Knee replacement being considered but she needs to lose weight before this could be undertaken. Elevated blood pressure readings in  the past.  Typically better at other doctor's office.  Continues to be elevated today. Home readings have been lower, she thought it was too low on one occasion.  She had a reading that required her to eat salt.     Current medicines are reviewed at length with the patient today.  The patient concerns regarding her medicines were addressed.  The following changes have been made:  No change  Labs/ tests ordered today include:  No orders of the defined types were placed in this encounter.   Recommend 150 minutes/week of aerobic exercise Low fat, low carb, high fiber diet recommended  Disposition:   FU in 1 year   Signed, Larae Grooms, MD  02/07/2023 1:32 PM    [facility] [place], [place], [place]  [address] Phone: [phone]; Fax: [phone]

## 2023-02-07 ENCOUNTER — Encounter: Payer: Self-pay | Admitting: Interventional Cardiology

## 2023-02-07 ENCOUNTER — Ambulatory Visit: Payer: Medicare Other | Attending: Interventional Cardiology | Admitting: Interventional Cardiology

## 2023-02-07 VITALS — BP 158/82 | HR 68 | Ht 64.0 in | Wt 233.0 lb

## 2023-02-07 DIAGNOSIS — Z8249 Family history of ischemic heart disease and other diseases of the circulatory system: Secondary | ICD-10-CM

## 2023-02-07 DIAGNOSIS — E782 Mixed hyperlipidemia: Secondary | ICD-10-CM | POA: Diagnosis not present

## 2023-02-07 DIAGNOSIS — R739 Hyperglycemia, unspecified: Secondary | ICD-10-CM | POA: Diagnosis not present

## 2023-02-07 DIAGNOSIS — I471 Supraventricular tachycardia, unspecified: Secondary | ICD-10-CM | POA: Insufficient documentation

## 2023-02-07 MED ORDER — METOPROLOL SUCCINATE ER 25 MG PO TB24: 25.0000 mg | ORAL_TABLET | ORAL | 1 refills | Status: AC | PRN

## 2023-02-07 NOTE — Patient Instructions (Signed)
Medication Instructions:  Your physician recommends that you continue on your current medications as directed. Please refer to the Current Medication list given to you today.  *If you need a refill on your cardiac medications before your next appointment, please call your pharmacy*   Lab Work: Your physician recommends that you return for lab work tomorrow--CBC, CMET, Lipids, A1C.  This will be fasting. The lab opens at 7:15 AM  If you have labs (blood work) drawn today and your tests are completely normal, you will receive your results only by: [place] (if you have MyChart) OR A paper copy in the mail If you have any lab test that is abnormal or we need to change your treatment, we will call you to review the results.   Testing/Procedures: none   Follow-Up: At [facility], you and your health needs are our priority.  As part of our continuing mission to provide you with exceptional heart care, we have created designated Provider Care Teams.  These Care Teams include your primary Cardiologist (physician) and Advanced Practice Providers (APPs -  Physician Assistants and Nurse Practitioners) who all work together to provide you with the care you need, when you need it.  We recommend signing up for the patient portal called "MyChart".  Sign up information is provided on this After Visit Summary.  MyChart is used to connect with patients for Virtual Visits (Telemedicine).  Patients are able to view lab/test results, encounter notes, upcoming appointments, etc.  Non-urgent messages can be sent to your provider as well.   To learn more about what you can do with MyChart, go to [email or web address].    Your next appointment:   12 month(s)  Provider:   Larae Grooms, MD     Other Instructions

## 2023-02-08 ENCOUNTER — Ambulatory Visit: Payer: Medicare Other

## 2023-02-09 DIAGNOSIS — H26493 Other secondary cataract, bilateral: Secondary | ICD-10-CM | POA: Diagnosis not present

## 2023-02-09 DIAGNOSIS — Z961 Presence of intraocular lens: Secondary | ICD-10-CM | POA: Diagnosis not present

## 2023-02-09 DIAGNOSIS — H52221 Regular astigmatism, right eye: Secondary | ICD-10-CM | POA: Diagnosis not present

## 2023-02-09 DIAGNOSIS — H353132 Nonexudative age-related macular degeneration, bilateral, intermediate dry stage: Secondary | ICD-10-CM | POA: Diagnosis not present

## 2023-02-12 ENCOUNTER — Ambulatory Visit: Payer: Medicare Other | Attending: Interventional Cardiology

## 2023-02-12 DIAGNOSIS — R739 Hyperglycemia, unspecified: Secondary | ICD-10-CM

## 2023-02-12 DIAGNOSIS — I471 Supraventricular tachycardia, unspecified: Secondary | ICD-10-CM

## 2023-02-12 DIAGNOSIS — E782 Mixed hyperlipidemia: Secondary | ICD-10-CM

## 2023-02-12 DIAGNOSIS — Z8249 Family history of ischemic heart disease and other diseases of the circulatory system: Secondary | ICD-10-CM | POA: Diagnosis not present

## 2023-02-13 ENCOUNTER — Telehealth: Payer: Self-pay | Admitting: *Deleted

## 2023-02-13 ENCOUNTER — Encounter: Payer: Self-pay | Admitting: *Deleted

## 2023-02-13 DIAGNOSIS — R072 Precordial pain: Secondary | ICD-10-CM

## 2023-02-13 DIAGNOSIS — I471 Supraventricular tachycardia, unspecified: Secondary | ICD-10-CM

## 2023-02-13 DIAGNOSIS — Z8249 Family history of ischemic heart disease and other diseases of the circulatory system: Secondary | ICD-10-CM

## 2023-02-13 LAB — COMPREHENSIVE METABOLIC PANEL

## 2023-02-13 LAB — LIPID PANEL

## 2023-02-13 MED ORDER — METOPROLOL TARTRATE 100 MG PO TABS: ORAL_TABLET | ORAL | 0 refills | Status: DC

## 2023-02-13 NOTE — Telephone Encounter (Signed)
Patient dropped off BP/heart rate readings in office.  Readings were all taken on 4/8 from 7:30 AM-8:22 AM.  Readings were 157/ 76, 75 149/77, 73 156/98, 71 167/104, 81 166/96, 85 125/68 Note on readings also stated patient wants to do CT Scan. Readings reviewed by Dr Eldridge Dace.  He would like patient to check BP once daily for 7-10 days and call readings to office.  OK to order cardiac CTA.  Patient should have lopressor 100 mg 2 hours prior to CT Scan.  I spoke with patient and gave her this information.  She reports her BP has now leveled off.  She had been drinking a lot of gatorade and has now stopped drinking it.  She would like to proceed with CT Scan.  Instructions reviewed with patient.  Copy will be sent to her through my chart and also mailed to her.

## 2023-02-14 LAB — CBC
Hematocrit: 44.7 % (ref 34.0–46.6)
Hemoglobin: 14.6 g/dL (ref 11.1–15.9)
MCH: 28.3 pg (ref 26.6–33.0)
MCHC: 32.7 g/dL (ref 31.5–35.7)
MCV: 87 fL (ref 79–97)
Platelets: 313 10*3/uL (ref 150–450)
RBC: 5.16 x10E6/uL (ref 3.77–5.28)
RDW: 13.5 % (ref 11.7–15.4)
WBC: 7.6 10*3/uL (ref 3.4–10.8)

## 2023-02-14 LAB — LIPID PANEL
Chol/HDL Ratio: 2 ratio (ref 0.0–4.4)
LDL Chol Calc (NIH): 45 mg/dL (ref 0–99)
Triglycerides: 107 mg/dL (ref 0–149)

## 2023-02-14 LAB — HEMOGLOBIN A1C
Est. average glucose Bld gHb Est-mCnc: 166 mg/dL
Hgb A1c MFr Bld: 7.4 % — ABNORMAL HIGH (ref 4.8–5.6)

## 2023-02-14 LAB — COMPREHENSIVE METABOLIC PANEL
ALT: 8 IU/L (ref 0–32)
Albumin/Globulin Ratio: 1.6 (ref 1.2–2.2)
Albumin: 4.3 g/dL (ref 3.8–4.8)
BUN/Creatinine Ratio: 22 (ref 12–28)
BUN: 17 mg/dL (ref 8–27)
Bilirubin Total: 0.5 mg/dL (ref 0.0–1.2)
CO2: 19 mmol/L — ABNORMAL LOW (ref 20–29)
Chloride: 106 mmol/L (ref 96–106)
Creatinine, Ser: 0.77 mg/dL (ref 0.57–1.00)
Globulin, Total: 2.7 g/dL (ref 1.5–4.5)
Glucose: 146 mg/dL — ABNORMAL HIGH (ref 70–99)
Potassium: 4.4 mmol/L (ref 3.5–5.2)

## 2023-02-16 NOTE — Addendum Note (Signed)
Addended by: Dossie Arbour on: 02/16/2023 11:51 AM   Modules accepted: Orders

## 2023-02-20 ENCOUNTER — Telehealth (HOSPITAL_COMMUNITY): Payer: Self-pay | Admitting: Emergency Medicine

## 2023-02-20 NOTE — Telephone Encounter (Signed)
Reaching out to patient to offer assistance regarding upcoming cardiac imaging study; pt verbalizes understanding of appt date/time, parking situation and where to check in, pre-test NPO status and medications ordered, and verified current allergies; name and call back number provided for further questions should they arise Ilma Achee RN Navigator Cardiac Imaging [facility] [phone] office [phone] cell 

## 2023-02-22 ENCOUNTER — Ambulatory Visit (HOSPITAL_COMMUNITY)
Admission: RE | Admit: 2023-02-22 | Discharge: 2023-02-22 | Disposition: A | Discharge status: Home / Self Care | Payer: Medicare Other | Source: Ambulatory Visit | Attending: Interventional Cardiology | Admitting: Interventional Cardiology

## 2023-02-22 ENCOUNTER — Ambulatory Visit (HOSPITAL_COMMUNITY): Payer: Medicare Other

## 2023-02-22 DIAGNOSIS — Z8249 Family history of ischemic heart disease and other diseases of the circulatory system: Secondary | ICD-10-CM | POA: Diagnosis not present

## 2023-02-22 DIAGNOSIS — K449 Diaphragmatic hernia without obstruction or gangrene: Secondary | ICD-10-CM | POA: Insufficient documentation

## 2023-02-22 DIAGNOSIS — I700 Atherosclerosis of aorta: Secondary | ICD-10-CM | POA: Insufficient documentation

## 2023-02-22 DIAGNOSIS — I471 Supraventricular tachycardia, unspecified: Secondary | ICD-10-CM

## 2023-02-22 DIAGNOSIS — R072 Precordial pain: Secondary | ICD-10-CM

## 2023-02-22 MED ORDER — NITROGLYCERIN 0.4 MG SL SUBL
SUBLINGUAL_TABLET | SUBLINGUAL | Status: AC
  Filled 2023-02-22: qty 2

## 2023-02-22 MED ORDER — IOHEXOL 350 MG/ML SOLN
95.0000 mL | Freq: Once | INTRAVENOUS | Status: AC | PRN
  Administered 2023-02-22: 95 mL via INTRAVENOUS

## 2023-02-22 MED ORDER — NITROGLYCERIN 0.4 MG SL SUBL
0.8000 mg | SUBLINGUAL_TABLET | Freq: Once | SUBLINGUAL | Status: AC
  Administered 2023-02-22: 0.8 mg via SUBLINGUAL

## 2023-02-26 DIAGNOSIS — E1169 Type 2 diabetes mellitus with other specified complication: Secondary | ICD-10-CM | POA: Diagnosis not present

## 2023-02-26 DIAGNOSIS — E785 Hyperlipidemia, unspecified: Secondary | ICD-10-CM | POA: Diagnosis not present

## 2023-02-26 DIAGNOSIS — M5430 Sciatica, unspecified side: Secondary | ICD-10-CM | POA: Diagnosis not present

## 2023-02-26 DIAGNOSIS — I700 Atherosclerosis of aorta: Secondary | ICD-10-CM | POA: Diagnosis not present

## 2023-03-14 DIAGNOSIS — M1712 Unilateral primary osteoarthritis, left knee: Secondary | ICD-10-CM | POA: Diagnosis not present

## 2023-03-15 ENCOUNTER — Telehealth: Payer: Self-pay | Admitting: *Deleted

## 2023-03-15 NOTE — Telephone Encounter (Signed)
   Pre-operative Risk Assessment    Patient Name: Anita Moore  DOB: 1940/12/27 MRN: [id number]      Request for Surgical Clearance    Procedure:   Left Total Knee Arthroplasty  Date of Surgery:  Clearance 04/24/23                                 Surgeon:  Dr. Durene Romans Surgeon's Group or Practice Name:  [facility] Phone number:  [phone] Fax number:  [phone]   Type of Clearance Requested:   - Medical    Type of Anesthesia:  Spinal   Additional requests/questions:    Signed, Emmit Pomfret   03/15/2023, 10:56 AM

## 2023-03-16 NOTE — Telephone Encounter (Signed)
Minimal nonobstructive CAD by CTA.  No further cardiac testing needed before TKR.

## 2023-03-16 NOTE — Telephone Encounter (Signed)
   Patient Name: Anita Moore  DOB: October 09, 1941 MRN: [id number]  Primary Cardiologist: Lance Muss, MD  Chart reviewed as part of pre-operative protocol coverage. Pre-op clearance already addressed by colleagues in earlier phone notes. To summarize recommendations:  Minimal nonobstructive CAD by CTA.  No further cardiac testing needed before TKR  -Dr. Eldridge Dace  No medications indicated as needing held.   Will route this bundled recommendation to requesting provider via Epic fax function and remove from pre-op pool. Please call with questions.  Sharlene Dory, PA-C 03/16/2023, 9:36 AM

## 2023-04-03 DIAGNOSIS — E785 Hyperlipidemia, unspecified: Secondary | ICD-10-CM | POA: Diagnosis not present

## 2023-04-03 DIAGNOSIS — E1169 Type 2 diabetes mellitus with other specified complication: Secondary | ICD-10-CM | POA: Diagnosis not present

## 2023-04-04 ENCOUNTER — Encounter (HOSPITAL_COMMUNITY): Payer: Self-pay

## 2023-04-06 ENCOUNTER — Telehealth: Payer: Self-pay | Admitting: Interventional Cardiology

## 2023-04-06 NOTE — Telephone Encounter (Signed)
See 5/09 encounter  Patient states Dr. Nilsa Nutting office has not received clearance and they are requesting to have it sent again.

## 2023-04-06 NOTE — Telephone Encounter (Signed)
Clearance was fax on 03/15/23 by Jari Favre, PAC. I will re-fax notes today.

## 2023-04-10 DIAGNOSIS — M1712 Unilateral primary osteoarthritis, left knee: Secondary | ICD-10-CM | POA: Diagnosis not present

## 2023-04-10 DIAGNOSIS — Z01818 Encounter for other preprocedural examination: Secondary | ICD-10-CM | POA: Diagnosis not present

## 2023-04-10 NOTE — Patient Instructions (Signed)
SURGICAL WAITING ROOM VISITATION  Patients having surgery or a procedure may have no more than 2 support people in the waiting area - these visitors may rotate.    Children under the age of 56 must have an adult with them who is not the patient.  Due to an increase in RSV and influenza rates and associated hospitalizations, children ages 65 and under may not visit patients in [facility] hospitals.  If the patient needs to stay at the hospital during part of their recovery, the visitor guidelines for inpatient rooms apply. Pre-op nurse will coordinate an appropriate time for 1 support person to accompany patient in pre-op.  This support person may not rotate.    Please refer to the [facility] website for the visitor guidelines for Inpatients (after your surgery is over and you are in a regular room).    Your procedure is scheduled on: 04/24/23   Report to [facility] Main Entrance    Report to admitting at 10:20 AM   Call this number if you have problems the morning of surgery [phone]   Do not eat food :After Midnight.   After Midnight you may have the following liquids until 9:50 AM DAY OF SURGERY  Water Non-Citrus Juices (without pulp, NO RED-Apple, White grape, White cranberry) Black Coffee (NO MILK/CREAM OR CREAMERS, sugar ok)  Clear Tea (NO MILK/CREAM OR CREAMERS, sugar ok) regular and decaf                             Plain Jell-O (NO RED)                                           Fruit ices (not with fruit pulp, NO RED)                                     Popsicles (NO RED)                                                               Sports drinks like Gatorade (NO RED)    The day of surgery:  Drink ONE (1) Pre-Surgery Clear Ensure at 9:50 AM the morning of surgery. Drink in one sitting. Do not sip.  This drink was given to you during your hospital  pre-op appointment visit. Nothing else to drink after completing the  Pre-Surgery Clear Ensure.          If  you have questions, please contact your surgeon's office.   FOLLOW BOWEL PREP AND ANY ADDITIONAL PRE OP INSTRUCTIONS YOU RECEIVED FROM YOUR SURGEON'S OFFICE!!!     Oral Hygiene is also important to reduce your risk of infection.                                    Remember - BRUSH YOUR TEETH THE MORNING OF SURGERY WITH YOUR REGULAR TOOTHPASTE  DENTURES WILL BE REMOVED PRIOR TO SURGERY PLEASE DO NOT APPLY "Poly grip" OR ADHESIVES!!!  Take these medicines the morning of surgery with A SIP OF WATER: Metoprolol, Rosuvastatin   DO NOT TAKE ANY ORAL DIABETIC MEDICATIONS DAY OF YOUR SURGERY  How to Manage Your Diabetes Before and After Surgery  Why is it important to control my blood sugar before and after surgery? Improving blood sugar levels before and after surgery helps healing and can limit problems. A way of improving blood sugar control is eating a healthy diet by:  Eating less sugar and carbohydrates  Increasing activity/exercise  Talking with your doctor about reaching your blood sugar goals High blood sugars (greater than 180 mg/dL) can raise your risk of infections and slow your recovery, so you will need to focus on controlling your diabetes during the weeks before surgery. Make sure that the doctor who takes care of your diabetes knows about your planned surgery including the date and location.  How do I manage my blood sugar before surgery? Check your blood sugar at least 4 times a day, starting 2 days before surgery, to make sure that the level is not too high or low. Check your blood sugar the morning of your surgery when you wake up and every 2 hours until you get to the Short Stay unit. If your blood sugar is less than 70 mg/dL, you will need to treat for low blood sugar: Do not take insulin. Treat a low blood sugar (less than 70 mg/dL) with  cup of clear juice (cranberry or apple), 4 glucose tablets, OR glucose gel. Recheck blood sugar in 15 minutes after treatment (to  make sure it is greater than 70 mg/dL). If your blood sugar is not greater than 70 mg/dL on recheck, call [phone] for further instructions. Report your blood sugar to the short stay nurse when you get to Short Stay.  If you are admitted to the hospital after surgery: Your blood sugar will be checked by the staff and you will probably be given insulin after surgery (instead of oral diabetes medicines) to make sure you have good blood sugar levels. The goal for blood sugar control after surgery is 80-180 mg/dL.  Reviewed and Endorsed by [facility] Patient Education Committee, August 2015                              You may not have any metal on your body including hair pins, jewelry, and body piercing             Do not wear make-up, lotions, powders, perfumes, or deodorant  Do not wear nail polish including gel and S&S, artificial/acrylic nails, or any other type of covering on natural nails including finger and toenails. If you have artificial nails, gel coating, etc. that needs to be removed by a nail salon please have this removed prior to surgery or surgery may need to be canceled/ delayed if the surgeon/ anesthesia feels like they are unable to be safely monitored.   Do not shave  48 hours prior to surgery.    Do not bring valuables to the hospital. [facility] IS NOT             RESPONSIBLE   FOR VALUABLES.   Contacts, glasses, dentures or bridgework may not be worn into surgery.   Bring small overnight bag day of surgery.   DO NOT BRING YOUR HOME MEDICATIONS TO THE HOSPITAL. PHARMACY WILL DISPENSE MEDICATIONS LISTED ON YOUR MEDICATION LIST TO YOU DURING YOUR ADMISSION IN  THE HOSPITAL!   Special Instructions: Bring a copy of your healthcare power of attorney and living will documents the day of surgery if you haven't scanned them before.              Please read over the following fact sheets you were given: IF YOU HAVE QUESTIONS ABOUT YOUR PRE-OP INSTRUCTIONS PLEASE CALL  [phone] Contras   If you received a COVID test during your pre-op visit  it is requested that you wear a mask when out in public, stay away from anyone that may not be feeling well and notify your surgeon if you develop symptoms. If you test positive for Covid or have been in contact with anyone that has tested positive in the last 10 days please notify you surgeon.      Pre-operative 5 CHG Bath Instructions   You can play a key role in reducing the risk of infection after surgery. Your skin needs to be as free of germs as possible. You can reduce the number of germs on your skin by washing with CHG (chlorhexidine gluconate) soap before surgery. CHG is an antiseptic soap that kills germs and continues to kill germs even after washing.   DO NOT use if you have an allergy to chlorhexidine/CHG or antibacterial soaps. If your skin becomes reddened or irritated, stop using the CHG and notify one of our RNs at [phone].   Please shower with the CHG soap starting 4 days before surgery using the following schedule:     Please keep in mind the following:  DO NOT shave, including legs and underarms, starting the day of your first shower.   You may shave your face at any point before/day of surgery.  Place clean sheets on your bed the day you start using CHG soap. Use a clean washcloth (not used since being washed) for each shower. DO NOT sleep with pets once you start using the CHG.   CHG Shower Instructions:  If you choose to wash your hair and private area, wash first with your normal shampoo/soap.  After you use shampoo/soap, rinse your hair and body thoroughly to remove shampoo/soap residue.  Turn the water OFF and apply about 3 tablespoons (45 ml) of CHG soap to a CLEAN washcloth.  Apply CHG soap ONLY FROM YOUR NECK DOWN TO YOUR TOES (washing for 3-5 minutes)  DO NOT use CHG soap on face, private areas, open wounds, or sores.  Pay special attention to the area where your surgery is  being performed.  If you are having back surgery, having someone wash your back for you may be helpful. Wait 2 minutes after CHG soap is applied, then you may rinse off the CHG soap.  Pat dry with a clean towel  Put on clean clothes/pajamas   If you choose to wear lotion, please use ONLY the CHG-compatible lotions on the back of this paper.     Additional instructions for the day of surgery: DO NOT APPLY any lotions, deodorants, cologne, or perfumes.   Put on clean/comfortable clothes.  Brush your teeth.  Ask your nurse before applying any prescription medications to the skin.      CHG Compatible Lotions   Aveeno Moisturizing lotion  Cetaphil Moisturizing Cream  Cetaphil Moisturizing Lotion  Clairol Herbal Essence Moisturizing Lotion, Dry Skin  Clairol Herbal Essence Moisturizing Lotion, Extra Dry Skin  Clairol Herbal Essence Moisturizing Lotion, Normal Skin  Curel Age Defying Therapeutic Moisturizing Lotion with Alpha Hydroxy  Curel Extreme Care Body  Lotion  Curel Soothing Hands Moisturizing Hand Lotion  Curel Therapeutic Moisturizing Cream, Fragrance-Free  Curel Therapeutic Moisturizing Lotion, Fragrance-Free  Curel Therapeutic Moisturizing Lotion, Original Formula  Eucerin Daily Replenishing Lotion  Eucerin Dry Skin Therapy Plus Alpha Hydroxy Crme  Eucerin Dry Skin Therapy Plus Alpha Hydroxy Lotion  Eucerin Original Crme  Eucerin Original Lotion  Eucerin Plus Crme Eucerin Plus Lotion  Eucerin TriLipid Replenishing Lotion  Keri Anti-Bacterial Hand Lotion  Keri Deep Conditioning Original Lotion Dry Skin Formula Softly Scented  Keri Deep Conditioning Original Lotion, Fragrance Free Sensitive Skin Formula  Keri Lotion Fast Absorbing Fragrance Free Sensitive Skin Formula  Keri Lotion Fast Absorbing Softly Scented Dry Skin Formula  Keri Original Lotion  Keri Skin Renewal Lotion Keri Silky Smooth Lotion  Keri Silky Smooth Sensitive Skin Lotion  Nivea Body Creamy  Conditioning Oil  Nivea Body Extra Enriched Lotion  Nivea Body Original Lotion  Nivea Body Sheer Moisturizing Lotion Nivea Crme  Nivea Skin Firming Lotion  NutraDerm 30 Skin Lotion  NutraDerm Skin Lotion  NutraDerm Therapeutic Skin Cream  NutraDerm Therapeutic Skin Lotion  ProShield Protective Hand Cream  Provon moisturizing lotion   Incentive Spirometer  An incentive spirometer is a tool that can help keep your lungs clear and active. This tool measures how well you are filling your lungs with each breath. Taking long deep breaths may help reverse or decrease the chance of developing breathing (pulmonary) problems (especially infection) following: A long period of time when you are unable to move or be active. BEFORE THE PROCEDURE  If the spirometer includes an indicator to show your best effort, your nurse or respiratory therapist will set it to a desired goal. If possible, sit up straight or lean slightly forward. Try not to slouch. Hold the incentive spirometer in an upright position. INSTRUCTIONS FOR USE  Sit on the edge of your bed if possible, or sit up as far as you can in bed or on a chair. Hold the incentive spirometer in an upright position. Breathe out normally. Place the mouthpiece in your mouth and seal your lips tightly around it. Breathe in slowly and as deeply as possible, raising the piston or the ball toward the top of the column. Hold your breath for 3-5 seconds or for as long as possible. Allow the piston or ball to fall to the bottom of the column. Remove the mouthpiece from your mouth and breathe out normally. Rest for a few seconds and repeat Steps 1 through 7 at least 10 times every 1-2 hours when you are awake. Take your time and take a few normal breaths between deep breaths. The spirometer may include an indicator to show your best effort. Use the indicator as a goal to work toward during each repetition. After each set of 10 deep breaths, practice coughing  to be sure your lungs are clear. If you have an incision (the cut made at the time of surgery), support your incision when coughing by placing a pillow or rolled up towels firmly against it. Once you are able to get out of bed, walk around indoors and cough well. You may stop using the incentive spirometer when instructed by your caregiver.  RISKS AND COMPLICATIONS Take your time so you do not get dizzy or light-headed. If you are in pain, you may need to take or ask for pain medication before doing incentive spirometry. It is harder to take a deep breath if you are having pain. AFTER USE Rest and breathe slowly and  easily. It can be helpful to keep track of a log of your progress. Your caregiver can provide you with a simple table to help with this. If you are using the spirometer at home, follow these instructions: SEEK MEDICAL CARE IF:  You are having difficultly using the spirometer. You have trouble using the spirometer as often as instructed. Your pain medication is not giving enough relief while using the spirometer. You develop fever of 100.5 F (38.1 C) or higher. SEEK IMMEDIATE MEDICAL CARE IF:  You cough up bloody sputum that had not been present before. You develop fever of 102 F (38.9 C) or greater. You develop worsening pain at or near the incision site. MAKE SURE YOU:  Understand these instructions. Will watch your condition. Will get help right away if you are not doing well or get worse. Document Released: 03/05/2007 Document Revised: 01/15/2012 Document Reviewed: 05/06/2007 [facility] Patient Information 2014 [place], [place].   ________________________________________________________________________

## 2023-04-10 NOTE — Progress Notes (Signed)
COVID Vaccine Completed:  Date of COVID positive in last 90 days:  PCP - Joycelyn Rua, MD Cardiologist - Lance Muss, MD LOV 02/07/23  Cardiac clearance by Jari Favre 03/16/23 in Epic  Chest x-ray -  EKG - 02/07/23 Epic Stress Test -  ECHO - 2011 Cardiac Cath -  Pacemaker/ICD device last checked: Spinal Cord Stimulator:  Bowel Prep -   Sleep Study -  CPAP -   Fasting Blood Sugar -  Checks Blood Sugar _____ times a day  Last dose of GLP1 agonist-  N/A GLP1 instructions:  N/A   Last dose of SGLT-2 inhibitors-  N/A SGLT-2 instructions: N/A   Blood Thinner Instructions:  Time Aspirin Instructions: Last Dose:  Activity level:  Can go up a flight of stairs and perform activities of daily living without stopping and without symptoms of chest pain or shortness of breath.  Able to exercise without symptoms  Unable to go up a flight of stairs without symptoms of     Anesthesia review:   Patient denies shortness of breath, fever, cough and chest pain at PAT appointment  Patient verbalized understanding of instructions that were given to them at the PAT appointment. Patient was also instructed that they will need to review over the PAT instructions again at home before surgery.

## 2023-04-11 ENCOUNTER — Encounter (HOSPITAL_COMMUNITY)
Admission: RE | Admit: 2023-04-11 | Discharge: 2023-04-11 | Disposition: A | Discharge status: Home / Self Care | Payer: Medicare Other | Source: Ambulatory Visit | Attending: Orthopedic Surgery | Admitting: Orthopedic Surgery

## 2023-04-11 ENCOUNTER — Encounter (HOSPITAL_COMMUNITY): Payer: Self-pay

## 2023-04-11 ENCOUNTER — Other Ambulatory Visit: Payer: Self-pay

## 2023-04-11 VITALS — BP 150/61 | HR 76 | Temp 98.4°F | Resp 14 | Ht 62.5 in | Wt 218.0 lb

## 2023-04-11 DIAGNOSIS — Z01818 Encounter for other preprocedural examination: Secondary | ICD-10-CM

## 2023-04-11 DIAGNOSIS — Z01812 Encounter for preprocedural laboratory examination: Secondary | ICD-10-CM | POA: Diagnosis not present

## 2023-04-11 DIAGNOSIS — E119 Type 2 diabetes mellitus without complications: Secondary | ICD-10-CM | POA: Diagnosis not present

## 2023-04-11 DIAGNOSIS — I471 Supraventricular tachycardia, unspecified: Secondary | ICD-10-CM

## 2023-04-11 HISTORY — DX: Type 2 diabetes mellitus without complications: E11.9

## 2023-04-11 LAB — CBC
HCT: 45.8 % (ref 36.0–46.0)
Hemoglobin: 14.9 g/dL (ref 12.0–15.0)
MCH: 28.5 pg (ref 26.0–34.0)
MCHC: 32.5 g/dL (ref 30.0–36.0)
MCV: 87.7 fL (ref 80.0–100.0)
Platelets: 291 10*3/uL (ref 150–400)
RBC: 5.22 MIL/uL — ABNORMAL HIGH (ref 3.87–5.11)
RDW: 15 % (ref 11.5–15.5)
WBC: 8.3 10*3/uL (ref 4.0–10.5)
nRBC: 0 % (ref 0.0–0.2)

## 2023-04-11 LAB — BASIC METABOLIC PANEL
Anion gap: 9 (ref 5–15)
BUN: 20 mg/dL (ref 8–23)
CO2: 26 mmol/L (ref 22–32)
Calcium: 9.1 mg/dL (ref 8.9–10.3)
Chloride: 103 mmol/L (ref 98–111)
Creatinine, Ser: 0.73 mg/dL (ref 0.44–1.00)
GFR, Estimated: >60 mL/min (ref >60)
Glucose, Bld: 142 mg/dL — ABNORMAL HIGH (ref 70–99)
Potassium: 4 mmol/L (ref 3.5–5.1)
Sodium: 138 mmol/L (ref 135–145)

## 2023-04-11 LAB — SURGICAL PCR SCREEN
MRSA, PCR: NEGATIVE
Staphylococcus aureus: NEGATIVE

## 2023-04-11 LAB — GLUCOSE, CAPILLARY: Glucose-Capillary: 151 mg/dL — ABNORMAL HIGH (ref 70–99)

## 2023-04-12 NOTE — Progress Notes (Signed)
Spoke to lab regarding no A1C result. Per lab the order was cancelled, she was not able to tell by who. Draw DOS.

## 2023-04-23 NOTE — H&P (Signed)
TOTAL KNEE ADMISSION H&P  Patient is being admitted for left total knee arthroplasty.  Subjective:  Chief Complaint:left knee pain.  HPI: Anita Moore, 82 y.o. female, has a history of pain and functional disability in the left knee due to arthritis and has failed non-surgical conservative treatments for greater than 12 weeks to includeNSAID's and/or analgesics, corticosteriod injections, and weight reduction as appropriate.  Onset of symptoms was gradual, starting 2 years ago with gradually worsening course since that time. The patient noted no past surgery on the left knee(s).  Patient currently rates pain in the left knee(s) at 8 out of 10 with activity. Patient has worsening of pain with activity and weight bearing, pain that interferes with activities of daily living, and pain with passive range of motion.  Patient has evidence of joint space narrowing by imaging studies. There is no active infection.  Patient Active Problem List   Diagnosis Date Noted   Obesity 12/05/2016   Family history of coronary arteriosclerosis 12/05/2016   Paroxysmal supraventricular tachycardia 05/07/2014   S/P right TKA 11/05/2012   SHOULDER PAIN, BILATERAL 01/11/2011   LUMBAGO 01/11/2011   SCIATICA, LEFT 09/15/2009   DECREASED HEARING, RIGHT EAR 04/13/2008   OTITIS MEDIA, ACUTE 03/02/2008   ACUTE MAXILLARY SINUSITIS 03/02/2008   Pain in limb 06/24/2007   INSOMNIA, PERSISTENT 05/23/2007   OTITIS EXTERNA, ACUTE NEC 05/23/2007   Past Medical History:  Diagnosis Date   Arthritis 1990   osteoarthris knees   Bilateral shoulder pain    Diabetes mellitus without complication (HCC)    Dizziness and giddiness    DJD (degenerative joint disease)    RIGHT KNEE   Dysrhythmia 2010   SVT   Insomnia    Obesity    Paroxysmal supraventricular tachycardia 05/07/2014    Past Surgical History:  Procedure Laterality Date   CARDIAC CATHETERIZATION     CHOLECYSTECTOMY  1985   EXCISIONAL HEMORRHOIDECTOMY   1970   TONSILLECTOMY     as child   TOTAL KNEE ARTHROPLASTY  11/05/2012   Procedure: TOTAL KNEE ARTHROPLASTY;  Surgeon: [name], MD;  Location: [facility] ORS;  Service: Orthopedics;  Laterality: Right;  right     No current facility-administered medications for this encounter.   Current Outpatient Medications  Medication Sig Dispense Refill Last Dose   BERBERINE CHLORIDE PO Take 1 capsule by mouth daily.      hydroxypropyl methylcellulose / hypromellose (ISOPTO TEARS / GONIOVISC) 2.5 % ophthalmic solution Place 1 drop into both eyes as needed for dry eyes.      metoprolol succinate (TOPROL-XL) 25 MG 24 hr tablet Take 1 tablet (25 mg total) by mouth as needed (for Heart Rate). 90 tablet 1    OVER THE COUNTER MEDICATION Take 1 capsule by mouth at bedtime. Relaxium      rosuvastatin (CRESTOR) 20 MG tablet Take 1 tablet (20 mg total) by mouth daily. 90 tablet 0    metoprolol tartrate (LOPRESSOR) 100 MG tablet Take one tablet by mouth 2 hours prior to CT Scan (Patient not taking: Reported on 04/09/2023) 1 tablet 0 Not Taking   Allergies  Allergen Reactions   Fluticasone Anaphylaxis   Codeine Other (See Comments)    CHEST PAIN    Other     Rum chest pain and caused blindness    Social History   Tobacco Use   Smoking status: Never   Smokeless tobacco: Never  Substance Use Topics   Alcohol use: Not Currently    Family History  Problem Relation Age of Onset   Heart attack Mother    Diabetes Neg Hx    Hypertension Neg Hx      Review of Systems  Constitutional:  Negative for chills and fever.  Respiratory:  Negative for cough and shortness of breath.   Cardiovascular:  Negative for chest pain.  Gastrointestinal:  Negative for nausea and vomiting.  Musculoskeletal:  Positive for arthralgias.     Objective:  Physical Exam Well nourished and well developed. General: Alert and oriented x3, cooperative and pleasant, no acute distress. Head: normocephalic, atraumatic, neck  supple. Eyes: EOMI.  Musculoskeletal: Left knee exam: No palpable effusion, warmth erythema Tenderness medially and anteriorly No significant flexion contracture with flexion to 120 degrees Stable medial and lateral collateral ligaments No significant lower extremity edema, erythema or calf tenderness   Calves soft and nontender. Motor function intact in LE. Strength 5/5 LE bilaterally. Neuro: Distal pulses 2+. Sensation to light touch intact in LE.  Vital signs in last 24 hours:    Labs:   Estimated body mass index is 39.24 kg/m as calculated from the following:   Height as of 04/11/23: 5' 2.5" (1.588 m).   Weight as of 04/11/23: 98.9 kg.   Imaging Review Plain radiographs demonstrate severe degenerative joint disease of the left knee(s). The overall alignment isneutral. The bone quality appears to be adequate for age and reported activity level.      Assessment/Plan:  End stage arthritis, left knee   The patient history, physical examination, clinical judgment of the provider and imaging studies are consistent with end stage degenerative joint disease of the left knee(s) and total knee arthroplasty is deemed medically necessary. The treatment options including medical management, injection therapy arthroscopy and arthroplasty were discussed at length. The risks and benefits of total knee arthroplasty were presented and reviewed. The risks due to aseptic loosening, infection, stiffness, patella tracking problems, thromboembolic complications and other imponderables were discussed. The patient acknowledged the explanation, agreed to proceed with the plan and consent was signed. Patient is being admitted for inpatient treatment for surgery, pain control, PT, OT, prophylactic antibiotics, VTE prophylaxis, progressive ambulation and ADL's and discharge planning. The patient is planning to be discharged  home.   Therapy Plans: outpatient therapy at [facility] Disposition: Home with  daughter Planned DVT Prophylaxis: aspirin 81mg  BID DME needed: none PCP: Dr. Lenise Arena, Cardio: Dr. Eldridge Dace TXA: IV Allergies: rum , codeine Anesthesia Concerns: none BMI: 40.3 > weight recheck Last HgbA1c: 7.3%  Other: - Hx of right TKA by Dr. Charlann Boxer >> did well - ?? severe nausea/vomiting with pain meds - switched last time to something that worked - tramadol/hydromorphine, tylenol, robaxin, celebrex - No history of VTE or cancer - MEDS SENT AHEAD  Patient's anticipated LOS is less than 2 midnights, meeting these requirements: - Younger than 26 - Lives within 1 hour of care - Has a competent adult at home to recover with post-op recover - NO history of  - Chronic pain requiring opiods  - Diabetes  - Coronary Artery Disease  - Heart failure  - Heart attack  - Stroke  - DVT/VTE  - Cardiac arrhythmia  - Respiratory Failure/COPD  - Renal failure  - Anemia  - Advanced Liver disease  Rosalene Billings, PA-C Orthopedic Surgery [facility] [place] [phone]

## 2023-04-24 ENCOUNTER — Encounter (HOSPITAL_COMMUNITY): Payer: Self-pay | Admitting: Orthopedic Surgery

## 2023-04-24 ENCOUNTER — Other Ambulatory Visit: Payer: Self-pay

## 2023-04-24 ENCOUNTER — Ambulatory Visit (HOSPITAL_COMMUNITY): Payer: Medicare Other | Admitting: Anesthesiology

## 2023-04-24 ENCOUNTER — Observation Stay (HOSPITAL_COMMUNITY)
Admission: RE | Admit: 2023-04-24 | Discharge: 2023-04-25 | Disposition: A | Discharge status: Home / Self Care | Payer: Medicare Other | Attending: Orthopedic Surgery | Admitting: Orthopedic Surgery

## 2023-04-24 ENCOUNTER — Encounter (HOSPITAL_COMMUNITY)
Admission: RE | Disposition: A | Discharge status: Home / Self Care | Payer: Self-pay | Source: Home / Self Care | Attending: Orthopedic Surgery

## 2023-04-24 ENCOUNTER — Ambulatory Visit (HOSPITAL_BASED_OUTPATIENT_CLINIC_OR_DEPARTMENT_OTHER): Payer: Medicare Other | Admitting: Anesthesiology

## 2023-04-24 DIAGNOSIS — M1712 Unilateral primary osteoarthritis, left knee: Principal | ICD-10-CM | POA: Insufficient documentation

## 2023-04-24 DIAGNOSIS — G8918 Other acute postprocedural pain: Secondary | ICD-10-CM | POA: Diagnosis not present

## 2023-04-24 DIAGNOSIS — I471 Supraventricular tachycardia, unspecified: Secondary | ICD-10-CM

## 2023-04-24 DIAGNOSIS — E119 Type 2 diabetes mellitus without complications: Secondary | ICD-10-CM | POA: Insufficient documentation

## 2023-04-24 DIAGNOSIS — Z96652 Presence of left artificial knee joint: Secondary | ICD-10-CM

## 2023-04-24 DIAGNOSIS — Z96651 Presence of right artificial knee joint: Secondary | ICD-10-CM | POA: Insufficient documentation

## 2023-04-24 DIAGNOSIS — Z6839 Body mass index (BMI) 39.0-39.9, adult: Secondary | ICD-10-CM

## 2023-04-24 DIAGNOSIS — Z79899 Other long term (current) drug therapy: Secondary | ICD-10-CM | POA: Diagnosis not present

## 2023-04-24 HISTORY — PX: TOTAL KNEE ARTHROPLASTY: SHX125

## 2023-04-24 LAB — GLUCOSE, CAPILLARY
Glucose-Capillary: 142 mg/dL — ABNORMAL HIGH (ref 70–99)
Glucose-Capillary: 114 mg/dL — ABNORMAL HIGH (ref 70–99)

## 2023-04-24 SURGERY — ARTHROPLASTY, KNEE, TOTAL
Anesthesia: Spinal | Site: Knee | Laterality: Left

## 2023-04-24 MED ORDER — PHENOL 1.4 % MT LIQD: 1.0000 | OROMUCOSAL | Status: DC | PRN

## 2023-04-24 MED ORDER — PROPOFOL 10 MG/ML IV BOLUS
INTRAVENOUS | Status: DC | PRN
  Administered 2023-04-24: 40 mg via INTRAVENOUS

## 2023-04-24 MED ORDER — ONDANSETRON HCL 4 MG/2ML IJ SOLN: 4.0000 mg | Freq: Four times a day (QID) | INTRAMUSCULAR | Status: DC | PRN

## 2023-04-24 MED ORDER — FENTANYL CITRATE PF 50 MCG/ML IJ SOSY: 25.0000 ug | PREFILLED_SYRINGE | INTRAMUSCULAR | Status: DC | PRN

## 2023-04-24 MED ORDER — ONDANSETRON HCL 4 MG PO TABS: 4.0000 mg | ORAL_TABLET | Freq: Four times a day (QID) | ORAL | Status: DC | PRN

## 2023-04-24 MED ORDER — EPINEPHRINE PF 1 MG/ML IJ SOLN
INTRAMUSCULAR | Status: AC
  Filled 2023-04-24: qty 1

## 2023-04-24 MED ORDER — ASPIRIN 81 MG PO CHEW
81.0000 mg | CHEWABLE_TABLET | Freq: Two times a day (BID) | ORAL | Status: DC
  Administered 2023-04-24 – 2023-04-25 (×2): 81 mg via ORAL
  Filled 2023-04-24 (×2): qty 1

## 2023-04-24 MED ORDER — DIPHENHYDRAMINE HCL 12.5 MG/5ML PO ELIX: 12.5000 mg | ORAL_SOLUTION | ORAL | Status: DC | PRN

## 2023-04-24 MED ORDER — PROPOFOL 500 MG/50ML IV EMUL
INTRAVENOUS | Status: DC | PRN
  Administered 2023-04-24: 70 ug/kg/min via INTRAVENOUS

## 2023-04-24 MED ORDER — DEXAMETHASONE SODIUM PHOSPHATE 10 MG/ML IJ SOLN
INTRAMUSCULAR | Status: DC | PRN
  Administered 2023-04-24: 4 mg via INTRAVENOUS

## 2023-04-24 MED ORDER — BUPIVACAINE HCL (PF) 0.25 % IJ SOLN
INTRAMUSCULAR | Status: AC
  Filled 2023-04-24: qty 30

## 2023-04-24 MED ORDER — ONDANSETRON HCL 4 MG/2ML IJ SOLN
4.0000 mg | Freq: Once | INTRAMUSCULAR | Status: AC
  Administered 2023-04-24: 4 mg via INTRAVENOUS

## 2023-04-24 MED ORDER — STERILE WATER FOR IRRIGATION IR SOLN
Status: DC | PRN
  Administered 2023-04-24: 2000 mL

## 2023-04-24 MED ORDER — OXYCODONE HCL 5 MG PO TABS: 5.0000 mg | ORAL_TABLET | Freq: Once | ORAL | Status: DC | PRN

## 2023-04-24 MED ORDER — LIDOCAINE HCL (PF) 2 % IJ SOLN
INTRAMUSCULAR | Status: AC
  Filled 2023-04-24: qty 5

## 2023-04-24 MED ORDER — ONDANSETRON HCL 4 MG/2ML IJ SOLN: 4.0000 mg | Freq: Once | INTRAMUSCULAR | Status: DC | PRN

## 2023-04-24 MED ORDER — HYDROMORPHONE HCL 2 MG PO TABS
1.0000 mg | ORAL_TABLET | ORAL | Status: DC | PRN
  Administered 2023-04-25: 2 mg via ORAL
  Filled 2023-04-24: qty 1

## 2023-04-24 MED ORDER — MIDAZOLAM HCL 2 MG/2ML IJ SOLN
2.0000 mg | Freq: Once | INTRAMUSCULAR | Status: DC
  Filled 2023-04-24: qty 2

## 2023-04-24 MED ORDER — MECLIZINE HCL 25 MG PO TABS
12.5000 mg | ORAL_TABLET | Freq: Three times a day (TID) | ORAL | Status: DC | PRN
  Administered 2023-04-24: 12.5 mg via ORAL
  Filled 2023-04-24: qty 1

## 2023-04-24 MED ORDER — OXYCODONE HCL 5 MG/5ML PO SOLN: 5.0000 mg | Freq: Once | ORAL | Status: DC | PRN

## 2023-04-24 MED ORDER — METOCLOPRAMIDE HCL 5 MG/ML IJ SOLN: 5.0000 mg | Freq: Three times a day (TID) | INTRAMUSCULAR | Status: DC | PRN

## 2023-04-24 MED ORDER — SODIUM CHLORIDE 0.9 % IV SOLN: INTRAVENOUS | Status: DC

## 2023-04-24 MED ORDER — CEFAZOLIN SODIUM-DEXTROSE 2-4 GM/100ML-% IV SOLN
2.0000 g | Freq: Four times a day (QID) | INTRAVENOUS | Status: AC
  Administered 2023-04-24 – 2023-04-25 (×2): 2 g via INTRAVENOUS
  Filled 2023-04-24 (×2): qty 100

## 2023-04-24 MED ORDER — SODIUM CHLORIDE 0.9 % IR SOLN
Status: DC | PRN
  Administered 2023-04-24: 1000 mL

## 2023-04-24 MED ORDER — SODIUM CHLORIDE 0.9 % IV SOLN: 12.5000 mg | Freq: Four times a day (QID) | INTRAVENOUS | Status: DC | PRN

## 2023-04-24 MED ORDER — METOCLOPRAMIDE HCL 5 MG PO TABS: 5.0000 mg | ORAL_TABLET | Freq: Three times a day (TID) | ORAL | Status: DC | PRN

## 2023-04-24 MED ORDER — ONDANSETRON HCL 4 MG/2ML IJ SOLN
INTRAMUSCULAR | Status: AC
  Filled 2023-04-24: qty 2

## 2023-04-24 MED ORDER — CEFAZOLIN SODIUM-DEXTROSE 2-4 GM/100ML-% IV SOLN
2.0000 g | INTRAVENOUS | Status: AC
  Administered 2023-04-24: 2 g via INTRAVENOUS
  Filled 2023-04-24: qty 100

## 2023-04-24 MED ORDER — INSULIN ASPART 100 UNIT/ML IJ SOLN: 0.0000 [IU] | INTRAMUSCULAR | Status: DC | PRN

## 2023-04-24 MED ORDER — KETOROLAC TROMETHAMINE 30 MG/ML IJ SOLN
INTRAMUSCULAR | Status: AC
  Filled 2023-04-24: qty 1

## 2023-04-24 MED ORDER — 0.9 % SODIUM CHLORIDE (POUR BTL) OPTIME
TOPICAL | Status: DC | PRN
  Administered 2023-04-24: 1000 mL

## 2023-04-24 MED ORDER — PROPOFOL 1000 MG/100ML IV EMUL
INTRAVENOUS | Status: AC
  Filled 2023-04-24: qty 100

## 2023-04-24 MED ORDER — CHLORHEXIDINE GLUCONATE 0.12 % MT SOLN
15.0000 mL | Freq: Once | OROMUCOSAL | Status: AC
  Administered 2023-04-24: 15 mL via OROMUCOSAL

## 2023-04-24 MED ORDER — TRANEXAMIC ACID-NACL 1000-0.7 MG/100ML-% IV SOLN
1000.0000 mg | INTRAVENOUS | Status: AC
  Administered 2023-04-24: 1000 mg via INTRAVENOUS
  Filled 2023-04-24: qty 100

## 2023-04-24 MED ORDER — LACTATED RINGERS IV SOLN: INTRAVENOUS | Status: DC

## 2023-04-24 MED ORDER — ROPIVACAINE HCL 7.5 MG/ML IJ SOLN
INTRAMUSCULAR | Status: DC | PRN
  Administered 2023-04-24: 20 mL via PERINEURAL

## 2023-04-24 MED ORDER — FENTANYL CITRATE PF 50 MCG/ML IJ SOSY
100.0000 ug | PREFILLED_SYRINGE | Freq: Once | INTRAMUSCULAR | Status: AC
  Administered 2023-04-24: 50 ug via INTRAVENOUS
  Filled 2023-04-24: qty 2

## 2023-04-24 MED ORDER — HYDROMORPHONE HCL 2 MG PO TABS
2.0000 mg | ORAL_TABLET | ORAL | Status: DC | PRN
  Filled 2023-04-24: qty 1

## 2023-04-24 MED ORDER — HYDROMORPHONE HCL 1 MG/ML IJ SOLN: 0.5000 mg | INTRAMUSCULAR | Status: DC | PRN

## 2023-04-24 MED ORDER — MENTHOL 3 MG MT LOZG: 1.0000 | LOZENGE | OROMUCOSAL | Status: DC | PRN

## 2023-04-24 MED ORDER — DEXAMETHASONE SODIUM PHOSPHATE 10 MG/ML IJ SOLN
INTRAMUSCULAR | Status: AC
  Filled 2023-04-24: qty 1

## 2023-04-24 MED ORDER — METHOCARBAMOL 500 MG PO TABS: 500.0000 mg | ORAL_TABLET | Freq: Four times a day (QID) | ORAL | Status: DC | PRN

## 2023-04-24 MED ORDER — ORAL CARE MOUTH RINSE: 15.0000 mL | Freq: Once | OROMUCOSAL | Status: AC

## 2023-04-24 MED ORDER — POVIDONE-IODINE 10 % EX SWAB: 2.0000 | Freq: Once | CUTANEOUS | Status: DC

## 2023-04-24 MED ORDER — ACETAMINOPHEN 500 MG PO TABS
1000.0000 mg | ORAL_TABLET | Freq: Once | ORAL | Status: AC
  Administered 2023-04-24: 1000 mg via ORAL
  Filled 2023-04-24: qty 2

## 2023-04-24 MED ORDER — PHENYLEPHRINE HCL-NACL 20-0.9 MG/250ML-% IV SOLN
INTRAVENOUS | Status: AC
  Filled 2023-04-24: qty 250

## 2023-04-24 MED ORDER — SODIUM CHLORIDE (PF) 0.9 % IJ SOLN
INTRAMUSCULAR | Status: AC
  Filled 2023-04-24: qty 30

## 2023-04-24 MED ORDER — LIDOCAINE HCL (PF) 2 % IJ SOLN
INTRAMUSCULAR | Status: DC | PRN
  Administered 2023-04-24: 60 mg via INTRADERMAL

## 2023-04-24 MED ORDER — ROSUVASTATIN CALCIUM 20 MG PO TABS
20.0000 mg | ORAL_TABLET | Freq: Every day | ORAL | Status: DC
  Administered 2023-04-25: 20 mg via ORAL
  Filled 2023-04-24: qty 1

## 2023-04-24 MED ORDER — BUPIVACAINE IN DEXTROSE 0.75-8.25 % IT SOLN
INTRATHECAL | Status: DC | PRN
  Administered 2023-04-24: 1.4 mL via INTRATHECAL

## 2023-04-24 MED ORDER — ACETAMINOPHEN 500 MG PO TABS
1000.0000 mg | ORAL_TABLET | Freq: Four times a day (QID) | ORAL | Status: DC
  Administered 2023-04-24 – 2023-04-25 (×4): 1000 mg via ORAL
  Filled 2023-04-24 (×4): qty 2

## 2023-04-24 MED ORDER — TRANEXAMIC ACID-NACL 1000-0.7 MG/100ML-% IV SOLN
1000.0000 mg | Freq: Once | INTRAVENOUS | Status: AC
  Administered 2023-04-24: 1000 mg via INTRAVENOUS
  Filled 2023-04-24: qty 100

## 2023-04-24 MED ORDER — DOCUSATE SODIUM 100 MG PO CAPS
100.0000 mg | ORAL_CAPSULE | Freq: Two times a day (BID) | ORAL | Status: DC
  Administered 2023-04-24: 100 mg via ORAL
  Filled 2023-04-24 (×2): qty 1

## 2023-04-24 MED ORDER — DEXAMETHASONE SODIUM PHOSPHATE 10 MG/ML IJ SOLN
10.0000 mg | Freq: Once | INTRAMUSCULAR | Status: DC
  Filled 2023-04-24: qty 1

## 2023-04-24 MED ORDER — METOPROLOL SUCCINATE ER 25 MG PO TB24: 25.0000 mg | ORAL_TABLET | ORAL | Status: DC | PRN

## 2023-04-24 MED ORDER — SODIUM CHLORIDE (PF) 0.9 % IJ SOLN
INTRAMUSCULAR | Status: DC | PRN
  Administered 2023-04-24: 61 mL

## 2023-04-24 MED ORDER — ONDANSETRON HCL 4 MG/2ML IJ SOLN
INTRAMUSCULAR | Status: AC
  Administered 2023-04-24: 4 mg
  Filled 2023-04-24: qty 2

## 2023-04-24 MED ORDER — PHENYLEPHRINE HCL-NACL 20-0.9 MG/250ML-% IV SOLN
INTRAVENOUS | Status: DC | PRN
  Administered 2023-04-24: 40 ug/min via INTRAVENOUS

## 2023-04-24 MED ORDER — METHOCARBAMOL 1000 MG/10ML IJ SOLN: 500.0000 mg | Freq: Four times a day (QID) | INTRAVENOUS | Status: DC | PRN

## 2023-04-24 MED ORDER — BISACODYL 10 MG RE SUPP: 10.0000 mg | Freq: Every day | RECTAL | Status: DC | PRN

## 2023-04-24 MED ORDER — POLYETHYLENE GLYCOL 3350 17 G PO PACK
17.0000 g | PACK | Freq: Two times a day (BID) | ORAL | Status: DC
  Administered 2023-04-24: 17 g via ORAL
  Filled 2023-04-24: qty 1

## 2023-04-24 SURGICAL SUPPLY — 58 items
ADH SKN CLS APL DERMABOND .7 (GAUZE/BANDAGES/DRESSINGS) ×1
ATTUNE MED ANAT PAT 35 KNEE (Knees) IMPLANT
BAG COUNTER SPONGE SURGICOUNT (BAG) IMPLANT
BAG SPEC THK2 15X12 ZIP CLS (MISCELLANEOUS)
BAG SPNG CNTER NS LX DISP (BAG)
BAG ZIPLOCK 12X15 (MISCELLANEOUS) IMPLANT
BASEPLATE TIB CMT FB PCKT SZ4 (Stem) IMPLANT
BLADE SAW SGTL 11.0X1.19X90.0M (BLADE) IMPLANT
BLADE SAW SGTL 13.0X1.19X90.0M (BLADE) ×1 IMPLANT
BNDG CMPR 5X62 HK CLSR LF (GAUZE/BANDAGES/DRESSINGS) ×1
BNDG CMPR MED 10X6 ELC LF (GAUZE/BANDAGES/DRESSINGS) ×1
BNDG ELASTIC 6INX 5YD STR LF (GAUZE/BANDAGES/DRESSINGS) ×1 IMPLANT
BNDG ELASTIC 6X10 VLCR STRL LF (GAUZE/BANDAGES/DRESSINGS) IMPLANT
BOWL SMART MIX CTS (DISPOSABLE) ×1 IMPLANT
BSPLAT TIB 4 CMNT FX BRNG STRL (Stem) ×1 IMPLANT
CEMENT HV SMART SET (Cement) IMPLANT
COMP FEM CMT ATTUNE NRW 5 LT (Joint) ×1 IMPLANT
COMPONENT FEM CMT ATTN NRW 5LT (Joint) IMPLANT
COVER SURGICAL LIGHT HANDLE (MISCELLANEOUS) ×1 IMPLANT
CUFF TOURN SGL QUICK 34 (TOURNIQUET CUFF) ×1
CUFF TRNQT CYL 34X4.125X (TOURNIQUET CUFF) ×1 IMPLANT
DERMABOND ADVANCED .7 DNX12 (GAUZE/BANDAGES/DRESSINGS) ×1 IMPLANT
DRAPE U-SHAPE 47X51 STRL (DRAPES) ×1 IMPLANT
DRESSING AQUACEL AG SP 3.5X10 (GAUZE/BANDAGES/DRESSINGS) ×1 IMPLANT
DRSG AQUACEL AG ADV 3.5X10 (GAUZE/BANDAGES/DRESSINGS) IMPLANT
DRSG AQUACEL AG SP 3.5X10 (GAUZE/BANDAGES/DRESSINGS) ×1
DURAPREP 26ML APPLICATOR (WOUND CARE) ×2 IMPLANT
ELECT REM PT RETURN 15FT ADLT (MISCELLANEOUS) ×1 IMPLANT
GLOVE BIO SURGEON STRL SZ 6 (GLOVE) ×1 IMPLANT
GLOVE BIOGEL PI IND STRL 6.5 (GLOVE) ×1 IMPLANT
GLOVE BIOGEL PI IND STRL 7.5 (GLOVE) ×1 IMPLANT
GLOVE ORTHO TXT STRL SZ7.5 (GLOVE) ×2 IMPLANT
GOWN STRL REUS W/ TWL LRG LVL3 (GOWN DISPOSABLE) ×2 IMPLANT
GOWN STRL REUS W/TWL LRG LVL3 (GOWN DISPOSABLE) ×2
HANDPIECE INTERPULSE COAX TIP (DISPOSABLE) ×1
HOLDER FOLEY CATH W/STRAP (MISCELLANEOUS) IMPLANT
KIT TURNOVER KIT A (KITS) IMPLANT
LINER TIB ATTUNE FIX 5 8 LT (Liner) IMPLANT
MANIFOLD NEPTUNE II (INSTRUMENTS) ×1 IMPLANT
NDL SAFETY ECLIP 18X1.5 (MISCELLANEOUS) IMPLANT
NS IRRIG 1000ML POUR BTL (IV SOLUTION) ×1 IMPLANT
PACK TOTAL KNEE CUSTOM (KITS) ×1 IMPLANT
PIN FIX SIGMA LCS THRD HI (PIN) IMPLANT
PROTECTOR NERVE ULNAR (MISCELLANEOUS) ×1 IMPLANT
SET HNDPC FAN SPRY TIP SCT (DISPOSABLE) ×1 IMPLANT
SET PAD KNEE POSITIONER (MISCELLANEOUS) ×1 IMPLANT
SPIKE FLUID TRANSFER (MISCELLANEOUS) ×2 IMPLANT
SUT MNCRL AB 4-0 PS2 18 (SUTURE) ×1 IMPLANT
SUT STRATAFIX PDS+ 0 24IN (SUTURE) ×1 IMPLANT
SUT VIC AB 1 CT1 36 (SUTURE) ×1 IMPLANT
SUT VIC AB 2-0 CT1 27 (SUTURE) ×2
SUT VIC AB 2-0 CT1 TAPERPNT 27 (SUTURE) ×2 IMPLANT
SYR 3ML LL SCALE MARK (SYRINGE) ×1 IMPLANT
TOWEL GREEN STERILE FF (TOWEL DISPOSABLE) ×1 IMPLANT
TRAY FOLEY MTR SLVR 16FR STAT (SET/KITS/TRAYS/PACK) ×1 IMPLANT
TUBE SUCTION HIGH CAP CLEAR NV (SUCTIONS) ×1 IMPLANT
WATER STERILE IRR 1000ML POUR (IV SOLUTION) ×2 IMPLANT
WRAP KNEE MAXI GEL POST OP (GAUZE/BANDAGES/DRESSINGS) ×1 IMPLANT

## 2023-04-24 NOTE — OR Nursing (Signed)
Pt c/o nausea- verbal order for 4mg  Zofran IV received from Dr. Mal Amabile.  4mg  Zofran given IV push- patient states she is feeling better.

## 2023-04-24 NOTE — Interval H&P Note (Signed)
History and Physical Interval Note:  04/24/2023 11:45 AM  Anita Moore  has presented today for surgery, with the diagnosis of Left knee osteoarthritis.  The various methods of treatment have been discussed with the patient and family. After consideration of risks, benefits and other options for treatment, the patient has consented to  Procedure(s): TOTAL KNEE ARTHROPLASTY (Left) as a surgical intervention.  The patient's history has been reviewed, patient examined, no change in status, stable for surgery.  I have reviewed the patient's chart and labs.  Questions were answered to the patient's satisfaction.     [name]

## 2023-04-24 NOTE — Anesthesia Procedure Notes (Signed)
Spinal  Patient location during procedure: OR Start time: 04/24/2023 1:49 PM End time: 04/24/2023 1:52 PM Reason for block: surgical anesthesia Staffing Performed: anesthesiologist  Anesthesiologist: Beryle Lathe, MD Performed by: Beryle Lathe, MD Authorized by: Beryle Lathe, MD   Preanesthetic Checklist Completed: patient identified, IV checked, risks and benefits discussed, surgical consent, monitors and equipment checked, pre-op evaluation and timeout performed Spinal Block Patient position: sitting Prep: DuraPrep Patient monitoring: heart rate, cardiac monitor, continuous pulse ox and blood pressure Approach: midline Location: L3-4 Injection technique: single-shot Needle Needle type: Quincke  Needle gauge: 22 G Additional Notes Consent was obtained prior to the procedure with all questions answered and concerns addressed. Risks including, but not limited to, bleeding, infection, nerve damage, paralysis, failed block, inadequate analgesia, allergic reaction, high spinal, itching, and headache were discussed and the patient wished to proceed. Functioning IV was confirmed and monitors were applied. Sterile prep and drape, including hand hygiene, mask, and sterile gloves were used. The patient was positioned and the spine was prepped. The skin was anesthetized with lidocaine. Free flow of clear CSF was obtained prior to injecting local anesthetic into the CSF. The spinal needle aspirated freely following injection. The needle was carefully withdrawn. The patient tolerated the procedure well.   Leslye Peer, MD

## 2023-04-24 NOTE — Discharge Instructions (Signed)

## 2023-04-24 NOTE — Transfer of Care (Signed)
Immediate Anesthesia Transfer of Care Note  Patient: Anita Moore  Procedure(s) Performed: TOTAL KNEE ARTHROPLASTY (Left: Knee)  Patient Location: PACU  Anesthesia Type:Spinal  Level of Consciousness: awake, alert , oriented, and patient cooperative  Airway & Oxygen Therapy: Patient Spontanous Breathing and Patient connected to face mask oxygen  Post-op Assessment: Report given to RN and Post -op Vital signs reviewed and stable  Post vital signs: Reviewed and stable  Last Vitals:  Vitals Value Taken Time  BP 113/55 04/24/23 1531  Temp 36.7 C 04/24/23 1531  Pulse 72 04/24/23 1534  Resp 22 04/24/23 1534  SpO2 94 % 04/24/23 1534  Vitals shown include unvalidated device data.  Last Pain:  Vitals:   04/24/23 1531  TempSrc: Axillary  PainSc:          Complications: No notable events documented.

## 2023-04-24 NOTE — Progress Notes (Signed)
PT Cancellation Note  Patient Details Name: Anita Moore MRN: [id number] DOB: Mar 21, 1941   Cancelled Treatment:    Reason Eval/Treat Not Completed: Medical issues which prohibited therapy. Pt reports dizziness at rest. Pt reported she was not concerned about the L knee but the vertigo. Nursing administered meclizine with limited resolve.   [name], PT Acute Rehab   [name] 04/24/2023, 6:53 PM

## 2023-04-24 NOTE — Anesthesia Procedure Notes (Signed)
Anesthesia Regional Block: Adductor canal block   Pre-Anesthetic Checklist: , timeout performed,  Correct Patient, Correct Site, Correct Laterality,  Correct Procedure, Correct Position, site marked,  Risks and benefits discussed,  Surgical consent,  Pre-op evaluation,  At surgeon's request and post-op pain management  Laterality: Left  Prep: chloraprep       Needles:  Injection technique: Single-shot  Needle Type: Echogenic Needle     Needle Length: 10cm  Needle Gauge: 21     Additional Needles:   Narrative:  Start time: 04/24/2023 12:18 PM End time: 04/24/2023 12:21 PM Injection made incrementally with aspirations every 5 mL.  Performed by: Personally  Anesthesiologist: Beryle Lathe, MD  Additional Notes: No pain on injection. No increased resistance to injection. Injection made in 5cc increments. Good needle visualization. Patient tolerated the procedure well.

## 2023-04-24 NOTE — Care Plan (Signed)
Ortho Bundle Case Management Note  Patient Details  Name: Anita Moore MRN: [id number] Date of Birth: [birth year]                  L TKA on 04-24-23  DCP: Home with dtr  DME: No needs, has a RW  PT: EO on 04-27-23   DME Arranged:  N/A DME Agency:       Additional Comments: Please contact me with any questions of if this plan should need to change.   Ennis Forts, RN,CCM [facility]  [phone] 04/24/2023, 11:16 AM

## 2023-04-24 NOTE — Anesthesia Postprocedure Evaluation (Signed)
Anesthesia Post Note  Patient: Anita Moore  Procedure(s) Performed: TOTAL KNEE ARTHROPLASTY (Left: Knee)     Patient location during evaluation: PACU Anesthesia Type: Spinal Level of consciousness: awake and alert Pain management: pain level controlled Vital Signs Assessment: post-procedure vital signs reviewed and stable Respiratory status: spontaneous breathing and respiratory function stable Cardiovascular status: blood pressure returned to baseline and stable Postop Assessment: spinal receding and no apparent nausea or vomiting Anesthetic complications: no   No notable events documented.  Last Vitals:  Vitals:   04/24/23 1619 04/24/23 1623  BP: (!) 149/71   Pulse: 63 64  Resp: 15 18  Temp:    SpO2: 93% 97%    Last Pain:  Vitals:   04/24/23 1545  TempSrc:   PainSc: 0-No pain                 Beryle Lathe

## 2023-04-24 NOTE — Plan of Care (Signed)

## 2023-04-24 NOTE — Anesthesia Preprocedure Evaluation (Addendum)
Anesthesia Evaluation  Patient identified by MRN, date of birth, ID band Patient awake    Reviewed: Allergy & Precautions, NPO status , Patient's Chart, lab work & pertinent test results, reviewed documented beta blocker date and time   History of Anesthesia Complications Negative for: history of anesthetic complications  Airway Mallampati: II  TM Distance: >3 FB Neck ROM: Full    Dental  (+) Dental Advisory Given   Pulmonary neg pulmonary ROS   Pulmonary exam normal        Cardiovascular Normal cardiovascular exam+ dysrhythmias Supra Ventricular Tachycardia    '24 Coronary CT - IMPRESSION: 1. Coronary calcium score of 140. This was 31 percentile for age and sex matched control. 2. Plaque analysis not performed due to technical difficulty. 3. Normal coronary origin with right dominance. 4. Minimal non obstructive CAD, 0-24% stenosis. Proximal vessels are normal. Misregistration artifact degrade interpretation of mid to distal circumflex. 5. Large sliding hiatal hernia    Neuro/Psych negative neurological ROS  negative psych ROS   GI/Hepatic Neg liver ROS, hiatal hernia,,,  Endo/Other  diabetes, Well Controlled, Type 2  Morbid obesity  Renal/GU negative Renal ROS     Musculoskeletal  (+) Arthritis ,    Abdominal   Peds  Hematology negative hematology ROS (+)  Plt 291k    Anesthesia Other Findings   Reproductive/Obstetrics                             Anesthesia Physical Anesthesia Plan  ASA: 3  Anesthesia Plan: Spinal   Post-op Pain Management: Tylenol PO (pre-op)* and Regional block*   Induction:   PONV Risk Score and Plan: 2 and Treatment may vary due to age or medical condition, Propofol infusion and Ondansetron  Airway Management Planned: Natural Airway and Simple Face Mask  Additional Equipment: None  Intra-op Plan:   Post-operative Plan:   Informed Consent: I have  reviewed the patients History and Physical, chart, labs and discussed the procedure including the risks, benefits and alternatives for the proposed anesthesia with the patient or authorized representative who has indicated his/her understanding and acceptance.       Plan Discussed with: CRNA and Anesthesiologist  Anesthesia Plan Comments: (Labs reviewed, platelets acceptable. Discussed risks and benefits of spinal, including spinal/epidural hematoma, infection, failed block, and PDPH. Patient expressed understanding and wished to proceed. )       Anesthesia Quick Evaluation

## 2023-04-24 NOTE — Op Note (Signed)
NAME:  Anita Moore                      MEDICAL RECORD NO.:  [id number]                             FACILITY:  [facility]      PHYSICIAN:  Madlyn Frankel. Charlann Boxer, M.D.  DATE OF BIRTH:  [birth year]      DATE OF PROCEDURE:  04/24/2023                                     OPERATIVE REPORT         PREOPERATIVE DIAGNOSIS:  Left knee osteoarthritis.      POSTOPERATIVE DIAGNOSIS:  Left knee osteoarthritis.      FINDINGS:  The patient was noted to have complete loss of cartilage and   bone-on-bone arthritis with associated osteophytes in the medial and patellofemoral compartments of   the knee with a significant synovitis and associated effusion.  The patient had failed months of conservative treatment including medications, injection therapy, activity modification.     PROCEDURE:  Left total knee replacement.      COMPONENTS USED:  DePuy Attune FB CR MS knee   system, a size 5N femur, 4 tibia, size 8 mm CR MS AOX insert, and 35 anatomic patellar   button.      SURGEON:  Madlyn Frankel. Charlann Boxer, M.D.      ASSISTANT:  Rosalene Billings, PA-C.      ANESTHESIA:  Regional and Spinal.      SPECIMENS:  None.      COMPLICATION:  None.      DRAINS:  None.  EBL: <100 cc      TOURNIQUET TIME:   Total Tourniquet Time Documented: Thigh (Left) - 27 minutes Total: Thigh (Left) - 27 minutes  .      The patient was stable to the recovery room.      INDICATION FOR PROCEDURE:  Anita Moore is a 82 y.o. female patient of   mine.  The patient had been seen, evaluated, and treated for months conservatively in the   office with medication, activity modification, and injections.  The patient had   radiographic changes of bone-on-bone arthritis with endplate sclerosis and osteophytes noted.  Based on the radiographic changes and failed conservative measures, the patient   decided to proceed with definitive treatment, total knee replacement.  Risks of infection, DVT, component failure, need for revision  surgery, neurovascular injury were reviewed in the office setting.  The postop course was reviewed stressing the efforts to maximize post-operative satisfaction and function.  Consent was obtained for benefit of pain   relief.      PROCEDURE IN DETAIL:  The patient was brought to the operative theater.   Once adequate anesthesia, preoperative antibiotics, 2 gm of Ancef,1 gm of Tranexamic Acid, and 10 mg of Decadron administered, the patient was positioned supine with a left thigh tourniquet placed.  The  left lower extremity was prepped and draped in sterile fashion.  A time-   out was performed identifying the patient, planned procedure, and the appropriate extremity.      The left lower extremity was placed in the [facility] leg holder.  The leg was   exsanguinated, tourniquet elevated to 225 mmHg.  A midline incision was  made followed by median parapatellar arthrotomy.  Following initial   exposure, attention was first directed to the patella.  Precut   measurement was noted to be 22 mm.  I resected down to 13-14 mm and used a   35 anatomic patellar button to restore patellar height as well as cover the cut surface.      The lug holes were drilled and a metal shim was placed to protect the   patella from retractors and saw blade during the procedure.      At this point, attention was now directed to the femur.  The femoral   canal was opened with a drill, irrigated to try to prevent fat emboli.  An   intramedullary rod was passed at 3 degrees valgus, 11 mm of bone was   resected off the distal femur due to pre-operative flexion contracture.  Following this resection, the tibia was   subluxated anteriorly.  Using the extramedullary guide, 2 mm of bone was resected off   the proximal medial tibia.  We confirmed the gap would be   stable medially and laterally with a size 6 spacer block as well as confirmed that the tibial cut was perpendicular in the coronal plane, checking with an alignment  rod.      Once this was done, I sized the femur to be a size 5 in the anterior-   posterior dimension, chose a narrow component based on medial and   lateral dimension.  The size 5 rotation block was then pinned in   position anterior referenced using the C-clamp to set rotation.  The   anterior, posterior, and  chamfer cuts were made without difficulty nor   notching making certain that I was along the anterior cortex to help   with flexion gap stability.      The final shim cut was made off the lateral aspect of distal femur.      At this point, the tibia was sized to be a size 4.  The size 4 tray was   then pinned in position through the medial third of the tubercle,   drilled, and keel punched.  Trial reduction was now carried with a 5 femur,  4 tibia, a size 8 mm CR MS insert, and the 35 anatomic patella botton.  The knee was brought to full extension with good flexion stability with the patella   tracking through the trochlea without application of pressure.  Given   all these findings the trial components removed.  Final components were   opened and cement was mixed.  The knee was irrigated with normal saline solution and pulse lavage.  The synovial lining was   then injected with 30 cc of 0.25% Marcaine with epinephrine, 1 cc of Toradol and 30 cc of NS for a total of 61 cc.     Final implants were then cemented onto cleaned and dried cut surfaces of bone with the knee brought to extension with a size 8 mm CR MS trial insert.      Once the cement had fully cured, excess cement was removed   throughout the knee.  I confirmed that I was satisfied with the range of   motion and stability, and the final size 8 mm CR MS AOX insert was chosen.  It was   placed into the knee.      The tourniquet had been let down at 27 minutes.  No significant   hemostasis was required.  The  extensor mechanism was then reapproximated using #1 Vicryl and #1 Stratafix sutures with the knee   in flexion.   The   remaining wound was closed with 2-0 Vicryl and running 4-0 Monocryl.   The knee was cleaned, dried, dressed sterilely using Dermabond and   Aquacel dressing.  The patient was then   brought to recovery room in stable condition, tolerating the procedure   well.   Please note that Physician Assistant, Rosalene Billings, PA-C was present for the entirety of the case, and was utilized for pre-operative positioning, peri-operative retractor management, general facilitation of the procedure and for primary wound closure at the end of the case.              Madlyn Frankel Charlann Boxer, M.D.    04/24/2023 3:02 PM

## 2023-04-25 ENCOUNTER — Encounter (HOSPITAL_COMMUNITY): Payer: Self-pay | Admitting: Orthopedic Surgery

## 2023-04-25 DIAGNOSIS — M1712 Unilateral primary osteoarthritis, left knee: Secondary | ICD-10-CM | POA: Diagnosis not present

## 2023-04-25 DIAGNOSIS — Z79899 Other long term (current) drug therapy: Secondary | ICD-10-CM | POA: Diagnosis not present

## 2023-04-25 DIAGNOSIS — Z96651 Presence of right artificial knee joint: Secondary | ICD-10-CM | POA: Diagnosis not present

## 2023-04-25 DIAGNOSIS — E119 Type 2 diabetes mellitus without complications: Secondary | ICD-10-CM | POA: Diagnosis not present

## 2023-04-25 LAB — CBC
HCT: 41.2 % (ref 36.0–46.0)
Hemoglobin: 13.2 g/dL (ref 12.0–15.0)
MCH: 28.4 pg (ref 26.0–34.0)
MCHC: 32 g/dL (ref 30.0–36.0)
MCV: 88.6 fL (ref 80.0–100.0)
Platelets: 276 10*3/uL (ref 150–400)
RBC: 4.65 MIL/uL (ref 3.87–5.11)
RDW: 14.6 % (ref 11.5–15.5)
WBC: 13.2 10*3/uL — ABNORMAL HIGH (ref 4.0–10.5)
nRBC: 0 % (ref 0.0–0.2)

## 2023-04-25 LAB — BASIC METABOLIC PANEL
Anion gap: 7 (ref 5–15)
BUN: 19 mg/dL (ref 8–23)
CO2: 23 mmol/L (ref 22–32)
Calcium: 8.5 mg/dL — ABNORMAL LOW (ref 8.9–10.3)
Chloride: 106 mmol/L (ref 98–111)
Creatinine, Ser: 0.64 mg/dL (ref 0.44–1.00)
GFR, Estimated: >60 mL/min (ref >60)
Glucose, Bld: 236 mg/dL — ABNORMAL HIGH (ref 70–99)
Potassium: 3.7 mmol/L (ref 3.5–5.1)
Sodium: 136 mmol/L (ref 135–145)

## 2023-04-25 NOTE — Care Management Obs Status (Signed)
MEDICARE OBSERVATION STATUS NOTIFICATION   Patient Details  Name: Anita Moore MRN: [id number] Date of Birth: 07/26/1941   Medicare Observation Status Notification Given:  Yes    Amada Jupiter, LCSW 04/25/2023, 11:12 AM

## 2023-04-25 NOTE — Progress Notes (Signed)
Patient discharged to home w/ family. Given all belongings, instructions. Verbalized understanding of instructions. Escorted to pov via w/c. 

## 2023-04-25 NOTE — Progress Notes (Addendum)
   Subjective: 1 Day Post-Op Procedure(s) (LRB): TOTAL KNEE ARTHROPLASTY (Left) Patient reports pain as mild.   Patient seen in rounds with Dr. Charlann Boxer. Patient is well, and has had no acute complaints or problems. No acute events overnight. Foley catheter removed. Patient was not able to work with PT yesterday secondary to dizziness. We will start therapy today.   Objective: Vital signs in last 24 hours: Temp:  [97.8 F (36.6 C)-98.4 F (36.9 C)] 97.8 F (36.6 C) (06/19 0530) Pulse Rate:  [54-74] 61 (06/19 0530) Resp:  [13-20] 17 (06/19 0530) BP: (103-170)/(48-100) 138/56 (06/19 0530) SpO2:  [92 %-100 %] 94 % (06/19 0530) Weight:  [98.9 kg] 98.9 kg (06/18 1040)  Intake/Output from previous day:  Intake/Output Summary (Last 24 hours) at 04/25/2023 0742 Last data filed at 04/25/2023 0640 Gross per 24 hour  Intake 3344.62 ml  Output 2075 ml  Net 1269.62 ml     Intake/Output this shift: No intake/output data recorded.  Labs: Recent Labs    04/25/23 0340  HGB 13.2   Recent Labs    04/25/23 0340  WBC 13.2*  RBC 4.65  HCT 41.2  PLT 276   Recent Labs    04/25/23 0340  NA 136  K 3.7  CL 106  CO2 23  BUN 19  CREATININE 0.64  GLUCOSE 236*  CALCIUM 8.5*   No results for input(s): "LABPT", "INR" in the last 72 hours.  Exam: General - Patient is Alert and Oriented Extremity - Neurologically intact Sensation intact distally Intact pulses distally Dorsiflexion/Plantar flexion intact Dressing - dressing C/D/I Motor Function - intact, moving foot and toes well on exam.   Past Medical History:  Diagnosis Date   Arthritis 1990   osteoarthris knees   Bilateral shoulder pain    Diabetes mellitus without complication (HCC)    Dizziness and giddiness    DJD (degenerative joint disease)    RIGHT KNEE   Dysrhythmia 2010   SVT   Insomnia    Obesity    Paroxysmal supraventricular tachycardia 05/07/2014    Assessment/Plan: 1 Day Post-Op Procedure(s)  (LRB): TOTAL KNEE ARTHROPLASTY (Left) Principal Problem:   S/P total knee arthroplasty, left  Estimated body mass index is 39.24 kg/m as calculated from the following:   Height as of this encounter: 5' 2.5" (1.588 m).   Weight as of this encounter: 98.9 kg. Advance diet Up with therapy D/C IV fluids   Patient's anticipated LOS is less than 2 midnights, meeting these requirements: - Younger than 30 - Lives within 1 hour of care - Has a competent adult at home to recover with post-op recover - NO history of  - Chronic pain requiring opiods  - Diabetes  - Coronary Artery Disease  - Heart failure  - Heart attack  - Stroke  - DVT/VTE  - Cardiac arrhythmia  - Respiratory Failure/COPD  - Renal failure  - Anemia  - Advanced Liver disease     DVT Prophylaxis - Aspirin Weight bearing as tolerated.  Hgb stable at 13.2 this AM.  Hopefully dizziness will have resolved today.  BP stable.   Plan is to go Home after hospital stay. Plan for discharge today following 1-2 sessions of PT as long as they are meeting their goals. Patient is scheduled for OPPT. Follow up in the office in 2 weeks.   All post op meds sent previously - patient has at home.   Dennie Bible, PA-C Orthopedic Surgery [phone] 04/25/2023, 7:42 AM

## 2023-04-25 NOTE — TOC Transition Note (Signed)
Transition of Care [facility]) - CM/SW Discharge Note   Patient Details  Name: Anita Moore MRN: [id number] Date of Birth: 1941-01-25  Transition of Care [facility]) CM/SW Contact:  Amada Jupiter, LCSW Phone Number: 04/25/2023, 9:49 AM   Clinical Narrative:     Met with pt who confirms she has needed DME in the home.  OPPT already arranged with [facility].  No TOC needs.  Final next level of care: OP Rehab Barriers to Discharge: No Barriers Identified   Patient Goals and CMS Choice      Discharge Placement                         Discharge Plan and Services Additional resources added to the After Visit Summary for                  DME Arranged: N/A                    Social Determinants of Health (SDOH) Interventions SDOH Screenings   Food Insecurity: No Food Insecurity (04/24/2023)  Housing: Low Risk  (04/24/2023)  Transportation Needs: No Transportation Needs (04/24/2023)  Utilities: Not At Risk (04/24/2023)  Tobacco Use: Low Risk  (04/24/2023)     Readmission Risk Interventions     No data to display

## 2023-04-25 NOTE — Evaluation (Signed)
Physical Therapy Evaluation Patient Details Name: Anita Moore MRN: [id number] DOB: [birth year] Today's Date: 04/25/2023  History of Present Illness  82 yo female s/p L TKA. PMH: R TKA, SVTs, DM, obesity  Clinical Impression  Pt is s/p TKA resulting in the deficits listed below (see PT Problem List).  Pt is doing very well on eval, will see again in pm and pt will likely be ready for d/c later today   Pt will benefit from acute skilled PT to increase their independence and safety with mobility to allow discharge.         Recommendations for follow up therapy are one component of a multi-disciplinary discharge planning process, led by the attending physician.  Recommendations may be updated based on patient status, additional functional criteria and insurance authorization.  Follow Up Recommendations       Assistance Recommended at Discharge Intermittent Supervision/Assistance  Patient can return home with the following  A little help with bathing/dressing/bathroom;Assistance with cooking/housework;Assist for transportation;Help with stairs or ramp for entrance    Equipment Recommendations None recommended by PT  Recommendations for Other Services       Functional Status Assessment Patient has had a recent decline in their functional status and demonstrates the ability to make significant improvements in function in a reasonable and predictable amount of time.     Precautions / Restrictions Precautions Precautions: Fall;Knee Restrictions Weight Bearing Restrictions: No LLE Weight Bearing: Weight bearing as tolerated      Mobility  Bed Mobility Overal bed mobility: Needs Assistance Bed Mobility: Supine to Sit     Supine to sit: Supervision     General bed mobility comments: for safety    Transfers Overall transfer level: Needs assistance Equipment used: Rolling walker (2 wheels) Transfers: Sit to/from Stand Sit to Stand: Min guard           General  transfer comment: cues for hand placement, min/guard for safety    Ambulation/Gait    Pt amb ~ 49' with Rw and min/guard assist for safety. Cues  initially for sequence and RW position              Stairs            Wheelchair Mobility    Modified Rankin (Stroke Patients Only)       Balance Overall balance assessment: Mild deficits observed, not formally tested                                           Pertinent Vitals/Pain Pain Assessment Pain Assessment: 0-10 Pain Score: 3  Pain Location: L thigh Pain Descriptors / Indicators: Sore, Grimacing Pain Intervention(s): Limited activity within patient's tolerance, Monitored during session, Premedicated before session, Repositioned    Home Living Family/patient expects to be discharged to:: Private residence Living Arrangements: Alone Available Help at Discharge: Family Type of Home: House Home Access: Stairs to enter Entrance Stairs-Rails: None Entrance Stairs-Number of Steps: 1   Home Layout: One level Home Equipment: Agricultural consultant (2 wheels);Cane - single point      Prior Function                       Hand Dominance        Extremity/Trunk Assessment   Upper Extremity Assessment Upper Extremity Assessment: Overall WFL for tasks assessed    Lower Extremity Assessment Lower Extremity  Assessment: LLE deficits/detail LLE Deficits / Details: ankle WFL, knee extension and hip flexion 3/5, no quad lag noted       Communication      Cognition Arousal/Alertness: Awake/alert Behavior During Therapy: WFL for tasks assessed/performed Overall Cognitive Status: Within Functional Limits for tasks assessed                                          General Comments      Exercises Total Joint Exercises Ankle Circles/Pumps: AROM, Both, 10 reps Quad Sets: 10 reps, Both, AROM Heel Slides: AAROM, Left, 10 reps   Assessment/Plan    PT Assessment Patient needs  continued PT services  PT Problem List Decreased strength;Decreased range of motion;Decreased activity tolerance;Decreased mobility;Decreased knowledge of precautions;Pain;Decreased knowledge of use of DME       PT Treatment Interventions DME instruction;Therapeutic exercise;Gait training;Functional mobility training;Therapeutic activities;Patient/family education    PT Goals (Current goals can be found in the Care Plan section)  Acute Rehab PT Goals Patient Stated Goal: home PT Goal Formulation: With patient Time For Goal Achievement: 05/02/23 Potential to Achieve Goals: Good    Frequency 7X/week     Co-evaluation               AM-PAC PT "6 Clicks" Mobility  Outcome Measure Help needed turning from your back to your side while in a flat bed without using bedrails?: A Little Help needed moving from lying on your back to sitting on the side of a flat bed without using bedrails?: A Little Help needed moving to and from a bed to a chair (including a wheelchair)?: A Little Help needed standing up from a chair using your arms (e.g., wheelchair or bedside chair)?: A Little Help needed to walk in hospital room?: A Little Help needed climbing 3-5 steps with a railing? : A Little 6 Click Score: 18    End of Session Equipment Utilized During Treatment: Gait belt Activity Tolerance: Patient tolerated treatment well Patient left: with call bell/phone within reach;in chair;with chair alarm set Nurse Communication: Mobility status PT Visit Diagnosis: Other abnormalities of gait and mobility (R26.89);Difficulty in walking, not elsewhere classified (R26.2)    Time: 1610-9604 PT Time Calculation (min) (ACUTE ONLY): 21 min   Charges:   PT Evaluation $PT Eval Low Complexity: 1 Low          Bonnee Zertuche, PT  Acute Rehab Dept [facility]) [phone]  04/25/2023   [facility] 04/25/2023, 11:33 AM

## 2023-04-25 NOTE — Progress Notes (Signed)
PT NOTE   04/25/23 1400  PT Visit Information  Last PT Received On 04/25/23  Assistance Needed Pt making good progress toward goals. Feels ready to d/c home with dtr's assist as needed; declined to practice stairs and verbalizes technique  History of Present Illness 82 yo female s/p L TKA. PMH: R TKA, SVTs, DM, obesity  Subjective Data  Patient Stated Goal home  Precautions  Precautions Fall;Knee  Precaution Booklet Issued No  Restrictions  LLE Weight Bearing WBAT  Pain Assessment  Pain Assessment 0-10  Pain Score 3  Pain Location L thigh  Pain Descriptors / Indicators Sore;Grimacing  Pain Intervention(s) Limited activity within patient's tolerance;Premedicated before session;Monitored during session;Repositioned;Ice applied  Cognition  Arousal/Alertness Awake/alert  Behavior During Therapy WFL for tasks assessed/performed  Overall Cognitive Status Within Functional Limits for tasks assessed  Bed Mobility  General bed mobility comments in reclienr  Transfers  Overall transfer level Needs assistance  Equipment used Rolling walker (2 wheels)  Transfers Sit to/from Stand  Sit to Stand Supervision  General transfer comment cues for hand placement, supervision for safety  Ambulation/Gait  Ambulation/Gait assistance Supervision;Min guard  Gait Distance (Feet) 60 Feet  Assistive device Rolling walker (2 wheels)  Gait Pattern/deviations Step-to pattern;Step-through pattern  General Gait Details good gait stability with RW, no LOB or knee buckling. beginning to step through gait pattern without incr pain or instability  Balance  Overall balance assessment Mild deficits observed, not formally tested  Total Joint Exercises  Quad Sets  (pt is doing exercises on her own, demo'd to PT)  PT - End of Session  Equipment Utilized During Treatment Gait belt  Activity Tolerance Patient tolerated treatment well  Patient left in chair;with call bell/phone within reach;with chair alarm set   Nurse Communication Mobility status   PT - Assessment/Plan  PT Plan Current plan remains appropriate  PT Visit Diagnosis Other abnormalities of gait and mobility (R26.89);Difficulty in walking, not elsewhere classified (R26.2)  PT Frequency (ACUTE ONLY) 7X/week  Follow Up Recommendations Follow physician's recommendations for discharge plan and follow up therapies  Assistance recommended at discharge Intermittent Supervision/Assistance  Patient can return home with the following A little help with bathing/dressing/bathroom;Assistance with cooking/housework;Assist for transportation;Help with stairs or ramp for entrance  PT equipment None recommended by PT  AM-PAC PT "6 Clicks" Mobility Outcome Measure (Version 2)  Help needed turning from your back to your side while in a flat bed without using bedrails? 3  Help needed moving from lying on your back to sitting on the side of a flat bed without using bedrails? 3  Help needed moving to and from a bed to a chair (including a wheelchair)? 3  Help needed standing up from a chair using your arms (e.g., wheelchair or bedside chair)? 3  Help needed to walk in hospital room? 3  Help needed climbing 3-5 steps with a railing?  3  6 Click Score 18  Consider Recommendation of Discharge To: Home with [facility]  Acute Rehab PT Goals  PT Goal Formulation With patient  Time For Goal Achievement 05/02/23  Potential to Achieve Goals Good  PT Time Calculation  PT Start Time (ACUTE ONLY) 1356  PT Stop Time (ACUTE ONLY) 1412  PT Time Calculation (min) (ACUTE ONLY) 16 min  PT General Charges  $$ ACUTE PT VISIT 1 Visit  PT Treatments  $Gait Training 8-22 mins

## 2023-04-30 NOTE — Discharge Summary (Signed)
Patient ID: Anita Moore MRN: [id number] DOB/AGE: 80-05-1941 80 y.o.  Admit date: 04/24/2023 Discharge date: 04/25/2023  Admission Diagnoses:  Left knee osteoarthritis  Discharge Diagnoses:  Principal Problem:   S/P total knee arthroplasty, left   Past Medical History:  Diagnosis Date   Arthritis 1990   osteoarthris knees   Bilateral shoulder pain    Diabetes mellitus without complication (HCC)    Dizziness and giddiness    DJD (degenerative joint disease)    RIGHT KNEE   Dysrhythmia 2010   SVT   Insomnia    Obesity    Paroxysmal supraventricular tachycardia 05/07/2014    Surgeries: Procedure(s): TOTAL KNEE ARTHROPLASTY on 04/24/2023   Consultants:   Discharged Condition: Improved  Hospital Course: Anita Moore is an 82 y.o. female who was admitted 04/24/2023 for operative treatment ofS/P total knee arthroplasty, left. Patient has severe unremitting pain that affects sleep, daily activities, and work/hobbies. After pre-op clearance the patient was taken to the operating room on 04/24/2023 and underwent  Procedure(s): TOTAL KNEE ARTHROPLASTY.    Patient was given perioperative antibiotics:  Anti-infectives (From admission, onward)    Start     Dose/Rate Route Frequency Ordered Stop   04/24/23 2000  ceFAZolin (ANCEF) IVPB 2g/100 mL premix        2 g 200 mL/hr over 30 Minutes Intravenous Every 6 hours 04/24/23 1700 04/25/23 0251   04/24/23 1045  ceFAZolin (ANCEF) IVPB 2g/100 mL premix        2 g 200 mL/hr over 30 Minutes Intravenous On call to O.R. 04/24/23 1037 04/24/23 1418        Patient was given sequential compression devices, early ambulation, and chemoprophylaxis to prevent DVT. Patient worked with PT and was meeting their goals regarding safe ambulation and transfers.  Patient benefited maximally from hospital stay and there were no complications.    Recent vital signs: No data found.   Recent laboratory studies: No results for input(s):  "WBC", "HGB", "HCT", "PLT", "NA", "K", "CL", "CO2", "BUN", "CREATININE", "GLUCOSE", "INR", "CALCIUM" in the last 72 hours.  Invalid input(s): "PT", "2"   Discharge Medications:   Allergies as of 04/25/2023       Reactions   Fluticasone Anaphylaxis   Codeine Other (See Comments)   CHEST PAIN   Other    Rum chest pain and caused blindness        Medication List     STOP taking these medications    metoprolol tartrate 100 MG tablet Commonly known as: LOPRESSOR       TAKE these medications    BERBERINE CHLORIDE PO Take 1 capsule by mouth daily.   hydroxypropyl methylcellulose / hypromellose 2.5 % ophthalmic solution Commonly known as: ISOPTO TEARS / GONIOVISC Place 1 drop into both eyes as needed for dry eyes.   metoprolol succinate 25 MG 24 hr tablet Commonly known as: TOPROL-XL Take 1 tablet (25 mg total) by mouth as needed (for Heart Rate).   OVER THE COUNTER MEDICATION Take 1 capsule by mouth at bedtime. Relaxium   rosuvastatin 20 MG tablet Commonly known as: CRESTOR Take 1 tablet (20 mg total) by mouth daily.               Discharge Care Instructions  (From admission, onward)           Start     Ordered   04/25/23 0000  Change dressing       Comments: Maintain surgical dressing until follow up in the clinic.  If the edges start to pull up, may reinforce with tape. If the dressing is no longer working, may remove and cover with gauze and tape, but must keep the area dry and clean.  Call with any questions or concerns.   04/25/23 0744            Diagnostic Studies: No results found.  Disposition: Discharge disposition: 01-Home or Self Care       Discharge Instructions     Call MD / Call 911   Complete by: As directed    If you experience chest pain or shortness of breath, CALL 911 and be transported to the hospital emergency room.  If you develope a fever above 101 F, pus (white drainage) or increased drainage or redness at the  wound, or calf pain, call your surgeon's office.   Change dressing   Complete by: As directed    Maintain surgical dressing until follow up in the clinic. If the edges start to pull up, may reinforce with tape. If the dressing is no longer working, may remove and cover with gauze and tape, but must keep the area dry and clean.  Call with any questions or concerns.   Constipation Prevention   Complete by: As directed    Drink plenty of fluids.  Prune juice may be helpful.  You may use a stool softener, such as Colace (over the counter) 100 mg twice a day.  Use MiraLax (over the counter) for constipation as needed.   Diet - low sodium heart healthy   Complete by: As directed    Increase activity slowly as tolerated   Complete by: As directed    Weight bearing as tolerated with assist device (walker, cane, etc) as directed, use it as long as suggested by your surgeon or therapist, typically at least 4-6 weeks.   Post-operative opioid taper instructions:   Complete by: As directed    POST-OPERATIVE OPIOID TAPER INSTRUCTIONS: It is important to wean off of your opioid medication as soon as possible. If you do not need pain medication after your surgery it is ok to stop day one. Opioids include: Codeine, Hydrocodone(Norco, Vicodin), Oxycodone(Percocet, oxycontin) and hydromorphone amongst others.  Long term and even short term use of opiods can cause: Increased pain response Dependence Constipation Depression Respiratory depression And more.  Withdrawal symptoms can include Flu like symptoms Nausea, vomiting And more Techniques to manage these symptoms Hydrate well Eat regular healthy meals Stay active Use relaxation techniques(deep breathing, meditating, yoga) Do Not substitute Alcohol to help with tapering If you have been on opioids for less than two weeks and do not have pain than it is ok to stop all together.  Plan to wean off of opioids This plan should start within one week  post op of your joint replacement. Maintain the same interval or time between taking each dose and first decrease the dose.  Cut the total daily intake of opioids by one tablet each day Next start to increase the time between doses. The last dose that should be eliminated is the evening dose.      TED hose   Complete by: As directed    Use stockings (TED hose) for 2 weeks on both leg(s).  You may remove them at night for sleeping.        Follow-up Information     Durene Romans, MD. Schedule an appointment as soon as possible for a visit in 2 week(s).   Specialty: Orthopedic Surgery Contact information:  [address] [place] [place] [address] [phone]                  Signed: Cassandria Anger 04/30/2023, 1:46 PM

## 2023-05-03 DIAGNOSIS — M25562 Pain in left knee: Secondary | ICD-10-CM | POA: Diagnosis not present

## 2023-05-03 DIAGNOSIS — M25662 Stiffness of left knee, not elsewhere classified: Secondary | ICD-10-CM | POA: Diagnosis not present

## 2023-05-07 DIAGNOSIS — M25562 Pain in left knee: Secondary | ICD-10-CM | POA: Diagnosis not present

## 2023-05-07 DIAGNOSIS — M25662 Stiffness of left knee, not elsewhere classified: Secondary | ICD-10-CM | POA: Diagnosis not present

## 2023-05-09 DIAGNOSIS — M25662 Stiffness of left knee, not elsewhere classified: Secondary | ICD-10-CM | POA: Diagnosis not present

## 2023-05-09 DIAGNOSIS — M25562 Pain in left knee: Secondary | ICD-10-CM | POA: Diagnosis not present

## 2023-06-18 DIAGNOSIS — Z4789 Encounter for other orthopedic aftercare: Secondary | ICD-10-CM | POA: Diagnosis not present

## 2023-07-11 ENCOUNTER — Other Ambulatory Visit: Payer: Self-pay

## 2023-07-11 MED ORDER — ROSUVASTATIN CALCIUM 20 MG PO TABS: 20.0000 mg | ORAL_TABLET | Freq: Every day | ORAL | 2 refills | Status: AC

## 2023-08-30 DIAGNOSIS — R300 Dysuria: Secondary | ICD-10-CM | POA: Diagnosis not present

## 2023-08-30 DIAGNOSIS — R3989 Other symptoms and signs involving the genitourinary system: Secondary | ICD-10-CM | POA: Diagnosis not present

## 2023-08-30 DIAGNOSIS — N952 Postmenopausal atrophic vaginitis: Secondary | ICD-10-CM | POA: Diagnosis not present

## 2023-09-21 DIAGNOSIS — Z96653 Presence of artificial knee joint, bilateral: Secondary | ICD-10-CM | POA: Diagnosis not present

## 2024-01-22 DIAGNOSIS — S46211A Strain of muscle, fascia and tendon of other parts of biceps, right arm, initial encounter: Secondary | ICD-10-CM | POA: Diagnosis not present

## 2024-01-22 DIAGNOSIS — S46311A Strain of muscle, fascia and tendon of triceps, right arm, initial encounter: Secondary | ICD-10-CM | POA: Diagnosis not present

## 2024-01-31 DIAGNOSIS — I10 Essential (primary) hypertension: Secondary | ICD-10-CM | POA: Insufficient documentation

## 2024-01-31 DIAGNOSIS — E785 Hyperlipidemia, unspecified: Secondary | ICD-10-CM | POA: Insufficient documentation

## 2024-01-31 NOTE — Progress Notes (Signed)
  Cardiology Office Note:   Date:  02/03/2024  ID:  Anita Moore, DOB 09/15/1941, MRN [id number] PCP: Joycelyn Rua, MD  [facility] Providers Cardiologist:  Rollene Rotunda, MD {  History of Present Illness:   Anita Moore is a 83 y.o. female  who as previously seen by Dr. Eldridge Dace.    She has a history of SVT.  She will occasionally get some rapid heartbeats.  These are well-managed with metoprolol XL which she takes as needed.  She has not had to use this routinely.  She says that these typically happen with emotional stress.  She has a condo in [place].  She has a daughter who lives here has nuclear tach.  She splits her time.  She does have a large yard to take care of.  She has to drag garbage cans up the hill and she might get a little short of breath with this.  She denies any chest pressure, neck or arm discomfort.  She has not had any presyncope or syncope.    ROS: As stated in the HPI and negative for all other systems.  Studies Reviewed:    EKG:   NA  Risk Assessment/Calculations:        Physical Exam:   VS:  BP (!) 150/76 (BP Location: Left Arm, Patient Position: Sitting)   Pulse 81   Ht 5\' 7"  (1.702 m)   Wt 230 lb (104.3 kg)   SpO2 97%   BMI 36.02 kg/m    Wt Readings from Last 3 Encounters:  02/01/24 230 lb (104.3 kg)  04/24/23 218 lb (98.9 kg)  04/11/23 218 lb (98.9 kg)     GEN: Well nourished, well developed in no acute distress NECK: No JVD; No carotid bruits CARDIAC: RRR, no murmurs, rubs, gallops RESPIRATORY:  Clear to auscultation without rales, wheezing or rhonchi  ABDOMEN: Soft, non-tender, non-distended EXTREMITIES:  No edema; No deformity   ASSESSMENT AND PLAN:   SVT:   This is controlled with PRN beta blocker.  No change in therapy.  Call Ms. Kruer with the results and send results to Joycelyn Rua, MD  Hyperlipidemia: LDL was 41.  No change in therapy.    Elevated blood sugar: A1C is 7.6.  Plan per Joycelyn Rua, MD  Elevated blood pressure:  Her BP is elevated but she reports that at home it is typically 132/72.   No change in therapy.   Follow up with me in 12 months.   Signed, Rollene Rotunda, MD

## 2024-02-01 ENCOUNTER — Encounter: Payer: Self-pay | Admitting: Cardiology

## 2024-02-01 ENCOUNTER — Ambulatory Visit: Payer: PRIVATE HEALTH INSURANCE | Attending: Cardiology | Admitting: Cardiology

## 2024-02-01 VITALS — BP 150/76 | HR 81 | Ht 67.0 in | Wt 230.0 lb

## 2024-02-01 DIAGNOSIS — R739 Hyperglycemia, unspecified: Secondary | ICD-10-CM | POA: Insufficient documentation

## 2024-02-01 DIAGNOSIS — Z8249 Family history of ischemic heart disease and other diseases of the circulatory system: Secondary | ICD-10-CM | POA: Insufficient documentation

## 2024-02-01 DIAGNOSIS — I10 Essential (primary) hypertension: Secondary | ICD-10-CM | POA: Diagnosis not present

## 2024-02-01 DIAGNOSIS — I471 Supraventricular tachycardia, unspecified: Secondary | ICD-10-CM | POA: Insufficient documentation

## 2024-02-01 DIAGNOSIS — E785 Hyperlipidemia, unspecified: Secondary | ICD-10-CM | POA: Insufficient documentation

## 2024-02-01 NOTE — Patient Instructions (Signed)
 Medication Instructions:  No changes. *If you need a refill on your cardiac medications before your next appointment, please call your pharmacy*  Lab Work: Lipid, HgbA1C, CBC BMET today If you have labs (blood work) drawn today and your tests are completely normal, you will receive your results only by: MyChart Message (if you have MyChart) OR A paper copy in the mail If you have any lab test that is abnormal or we need to change your treatment, we will call you to review the results.   Follow-Up: At [facility], you and your health needs are our priority.  As part of our continuing mission to provide you with exceptional heart care, our providers are all part of one team.  This team includes your primary Cardiologist (physician) and Advanced Practice Providers or APPs (Physician Assistants and Nurse Practitioners) who all work together to provide you with the care you need, when you need it.  Your next appointment:   1 year(s)  Provider:   Rollene Rotunda, MD     We recommend signing up for the patient portal called "MyChart".  Sign up information is provided on this After Visit Summary.  MyChart is used to connect with patients for Virtual Visits (Telemedicine).  Patients are able to view lab/test results, encounter notes, upcoming appointments, etc.  Non-urgent messages can be sent to your provider as well.   To learn more about what you can do with MyChart, go to [email or web address].   Other Instructions       1st Floor: - Lobby - Registration  - Pharmacy  - Lab - Cafe  2nd Floor: - PV Lab - Diagnostic Testing (echo, CT, nuclear med)  3rd Floor: - Vacant  4th Floor: - TCTS (cardiothoracic surgery) - AFib Clinic - Structural Heart Clinic - Vascular Surgery  - Vascular Ultrasound  5th Floor: - HeartCare Cardiology (general and EP) - Clinical Pharmacy for coumadin, hypertension, lipid, weight-loss medications, and med management  appointments    Valet parking services will be available as well.

## 2024-02-02 LAB — BASIC METABOLIC PANEL WITH GFR
BUN/Creatinine Ratio: 24 (ref 12–28)
BUN: 17 mg/dL (ref 8–27)
CO2: 23 mmol/L (ref 20–29)
Calcium: 9.3 mg/dL (ref 8.7–10.3)
Chloride: 103 mmol/L (ref 96–106)
Creatinine, Ser: 0.71 mg/dL (ref 0.57–1.00)
Glucose: 132 mg/dL — ABNORMAL HIGH (ref 70–99)
Potassium: 4 mmol/L (ref 3.5–5.2)
Sodium: 142 mmol/L (ref 134–144)
eGFR: 85 mL/min/{1.73_m2} (ref >59)

## 2024-02-02 LAB — LIPID PANEL
Chol/HDL Ratio: 1.9 ratio (ref 0.0–4.4)
Cholesterol, Total: 124 mg/dL (ref 100–199)
HDL: 64 mg/dL (ref >39)
LDL Chol Calc (NIH): 41 mg/dL (ref 0–99)
Triglycerides: 108 mg/dL (ref 0–149)
VLDL Cholesterol Cal: 19 mg/dL (ref 5–40)

## 2024-02-02 LAB — CBC
Hematocrit: 45.1 % (ref 34.0–46.6)
Hemoglobin: 14.4 g/dL (ref 11.1–15.9)
MCH: 27.5 pg (ref 26.6–33.0)
MCHC: 31.9 g/dL (ref 31.5–35.7)
MCV: 86 fL (ref 79–97)
Platelets: 314 10*3/uL (ref 150–450)
RBC: 5.24 x10E6/uL (ref 3.77–5.28)
RDW: 14 % (ref 11.7–15.4)
WBC: 9.7 10*3/uL (ref 3.4–10.8)

## 2024-02-02 LAB — HEMOGLOBIN A1C
Est. average glucose Bld gHb Est-mCnc: 171 mg/dL
Hgb A1c MFr Bld: 7.6 % — ABNORMAL HIGH (ref 4.8–5.6)

## 2024-02-03 ENCOUNTER — Encounter: Payer: Self-pay | Admitting: Cardiology

## 2024-02-23 DIAGNOSIS — M25511 Pain in right shoulder: Secondary | ICD-10-CM | POA: Diagnosis not present

## 2024-02-23 DIAGNOSIS — M79604 Pain in right leg: Secondary | ICD-10-CM | POA: Diagnosis not present

## 2024-02-25 ENCOUNTER — Ambulatory Visit
Admission: RE | Admit: 2024-02-25 | Discharge: 2024-02-25 | Disposition: A | Discharge status: Home / Self Care | Source: Ambulatory Visit | Attending: Physician Assistant

## 2024-02-25 ENCOUNTER — Other Ambulatory Visit: Payer: Self-pay | Admitting: Physician Assistant

## 2024-02-25 DIAGNOSIS — M79604 Pain in right leg: Secondary | ICD-10-CM

## 2024-02-25 DIAGNOSIS — M899 Disorder of bone, unspecified: Secondary | ICD-10-CM | POA: Diagnosis not present

## 2024-02-25 DIAGNOSIS — Z96651 Presence of right artificial knee joint: Secondary | ICD-10-CM | POA: Diagnosis not present

## 2024-02-25 DIAGNOSIS — M79651 Pain in right thigh: Secondary | ICD-10-CM | POA: Diagnosis not present

## 2024-02-25 DIAGNOSIS — M1611 Unilateral primary osteoarthritis, right hip: Secondary | ICD-10-CM | POA: Diagnosis not present

## 2024-02-26 ENCOUNTER — Encounter: Payer: Self-pay | Admitting: Physician Assistant

## 2024-02-27 ENCOUNTER — Telehealth: Payer: Self-pay | Admitting: Cardiology

## 2024-02-27 NOTE — Telephone Encounter (Signed)
 Attempted to call patient, no answer left message requesting a call back.

## 2024-02-27 NOTE — Telephone Encounter (Signed)
 Pt returning call

## 2024-02-27 NOTE — Telephone Encounter (Signed)
 Pt c/o medication issue:  1. Name of Medication: Prednisone   2. How are you currently taking this medication (dosage and times per day)?   3. Are you having a reaction (difficulty breathing--STAT)? No  4. What is your medication issue? Pt wants to know if it's okay for her to take this medication again since last night she experience some SVT's after taking it around midnight. Please advise.   STAT if HR is under 50 or over 120  (normal HR is 60-100 beats per minute)  What is your heart rate? 144 last night but was fluctuating and she was able to get it down to 99 and that's when she went back to bed around 4am.   Do you have a log of your heart rate readings (document readings)? No  Do you have any other symptoms? No

## 2024-02-27 NOTE — Telephone Encounter (Signed)
 Patient identification verified by 2 forms.   Called and spoke to patient  Patient states:  -Before Easter, fell -Weeks later Saturday [facility] -Yesterday started Prednisone s/p fall 1 AM, 1 PM, 2 at bed time -SVT 144-145 yesterday, kept her awake.  -Took Metoprolol , spaced out yesterday.  -At 4am, went to bed due to being tired.   Patient denies:  -Symptoms currently -66 HR  Interventions/Plan: -Monitor symptoms.   Reviewed ED warning signs/precautions  Patient agrees with plan, no questions at this time

## 2024-02-27 NOTE — Telephone Encounter (Signed)
 Called pt, left a message with Dr. Atlas Lea response on the machine.

## 2024-03-04 DIAGNOSIS — Z6838 Body mass index (BMI) 38.0-38.9, adult: Secondary | ICD-10-CM | POA: Diagnosis not present

## 2024-03-04 DIAGNOSIS — R54 Age-related physical debility: Secondary | ICD-10-CM | POA: Diagnosis not present

## 2024-04-02 DIAGNOSIS — Z0000 Encounter for general adult medical examination without abnormal findings: Secondary | ICD-10-CM | POA: Diagnosis not present

## 2024-04-02 DIAGNOSIS — I471 Supraventricular tachycardia, unspecified: Secondary | ICD-10-CM | POA: Diagnosis not present

## 2024-04-02 DIAGNOSIS — M25511 Pain in right shoulder: Secondary | ICD-10-CM | POA: Diagnosis not present

## 2024-04-02 DIAGNOSIS — E119 Type 2 diabetes mellitus without complications: Secondary | ICD-10-CM | POA: Diagnosis not present

## 2024-04-02 DIAGNOSIS — Z1211 Encounter for screening for malignant neoplasm of colon: Secondary | ICD-10-CM | POA: Diagnosis not present

## 2024-04-02 DIAGNOSIS — R54 Age-related physical debility: Secondary | ICD-10-CM | POA: Diagnosis not present

## 2024-04-02 DIAGNOSIS — Z1331 Encounter for screening for depression: Secondary | ICD-10-CM | POA: Diagnosis not present

## 2024-04-02 DIAGNOSIS — Z6838 Body mass index (BMI) 38.0-38.9, adult: Secondary | ICD-10-CM | POA: Diagnosis not present

## 2024-04-02 DIAGNOSIS — I422 Other hypertrophic cardiomyopathy: Secondary | ICD-10-CM | POA: Diagnosis not present

## 2024-04-08 DIAGNOSIS — Z1212 Encounter for screening for malignant neoplasm of rectum: Secondary | ICD-10-CM | POA: Diagnosis not present

## 2024-04-08 DIAGNOSIS — Z1211 Encounter for screening for malignant neoplasm of colon: Secondary | ICD-10-CM | POA: Diagnosis not present

## 2024-04-30 ENCOUNTER — Other Ambulatory Visit (HOSPITAL_COMMUNITY): Payer: Self-pay | Admitting: Sports Medicine

## 2024-04-30 DIAGNOSIS — M25511 Pain in right shoulder: Secondary | ICD-10-CM

## 2024-05-03 ENCOUNTER — Ambulatory Visit (HOSPITAL_COMMUNITY)
Admission: RE | Admit: 2024-05-03 | Discharge: 2024-05-03 | Disposition: A | Discharge status: Home / Self Care | Payer: PRIVATE HEALTH INSURANCE | Source: Ambulatory Visit | Attending: Sports Medicine | Admitting: Sports Medicine

## 2024-05-03 DIAGNOSIS — M25511 Pain in right shoulder: Secondary | ICD-10-CM | POA: Diagnosis present

## 2024-05-16 DIAGNOSIS — Z96652 Presence of left artificial knee joint: Secondary | ICD-10-CM | POA: Diagnosis not present

## 2024-05-16 DIAGNOSIS — Z471 Aftercare following joint replacement surgery: Secondary | ICD-10-CM | POA: Diagnosis not present

## 2024-05-16 DIAGNOSIS — Z96653 Presence of artificial knee joint, bilateral: Secondary | ICD-10-CM | POA: Diagnosis not present

## 2024-06-10 DIAGNOSIS — M25511 Pain in right shoulder: Secondary | ICD-10-CM | POA: Diagnosis not present

## 2024-06-10 DIAGNOSIS — S4991XA Unspecified injury of right shoulder and upper arm, initial encounter: Secondary | ICD-10-CM | POA: Diagnosis not present

## 2024-06-18 DIAGNOSIS — M25511 Pain in right shoulder: Secondary | ICD-10-CM | POA: Diagnosis not present

## 2024-06-18 DIAGNOSIS — S4991XA Unspecified injury of right shoulder and upper arm, initial encounter: Secondary | ICD-10-CM | POA: Diagnosis not present

## 2024-06-23 DIAGNOSIS — M25511 Pain in right shoulder: Secondary | ICD-10-CM | POA: Diagnosis not present

## 2024-10-16 DIAGNOSIS — E785 Hyperlipidemia, unspecified: Secondary | ICD-10-CM | POA: Diagnosis not present

## 2024-10-16 DIAGNOSIS — E119 Type 2 diabetes mellitus without complications: Secondary | ICD-10-CM | POA: Diagnosis not present
